# Patient Record
Sex: Female | Born: 1965 | Hispanic: Yes | Marital: Single | Smoking: Never smoker
Health system: Southern US, Community
[De-identification: ages and names within clinical notes are randomized; demographics above are authoritative.]

## PROBLEM LIST (undated history)

## (undated) DIAGNOSIS — E119 Type 2 diabetes mellitus without complications: Secondary | ICD-10-CM

## (undated) HISTORY — DX: Type 2 diabetes mellitus without complications: E11.9

---

## 1997-08-01 ENCOUNTER — Other Ambulatory Visit: Admission: RE | Admit: 1997-08-01 | Discharge: 1997-08-01 | Payer: Self-pay | Admitting: Obstetrics and Gynecology

## 1997-08-07 ENCOUNTER — Inpatient Hospital Stay (HOSPITAL_COMMUNITY): Admission: AD | Admit: 1997-08-07 | Discharge: 1997-08-07 | Payer: Self-pay | Admitting: Obstetrics & Gynecology

## 1997-08-17 ENCOUNTER — Ambulatory Visit (HOSPITAL_COMMUNITY): Admission: RE | Admit: 1997-08-17 | Discharge: 1997-08-17 | Payer: Self-pay | Admitting: Obstetrics and Gynecology

## 1997-08-31 ENCOUNTER — Other Ambulatory Visit: Admission: RE | Admit: 1997-08-31 | Discharge: 1997-08-31 | Payer: Self-pay | Admitting: Obstetrics and Gynecology

## 1997-09-26 ENCOUNTER — Other Ambulatory Visit: Admission: RE | Admit: 1997-09-26 | Discharge: 1997-09-26 | Payer: Self-pay | Admitting: Obstetrics and Gynecology

## 1997-10-03 ENCOUNTER — Other Ambulatory Visit: Admission: RE | Admit: 1997-10-03 | Discharge: 1997-10-03 | Payer: Self-pay | Admitting: Obstetrics and Gynecology

## 1997-10-24 ENCOUNTER — Encounter: Admission: RE | Admit: 1997-10-24 | Discharge: 1998-01-22 | Payer: Self-pay | Admitting: Obstetrics and Gynecology

## 1997-11-09 ENCOUNTER — Inpatient Hospital Stay (HOSPITAL_COMMUNITY): Admission: AD | Admit: 1997-11-09 | Discharge: 1997-11-09 | Payer: Self-pay | Admitting: *Deleted

## 1997-11-16 ENCOUNTER — Encounter (HOSPITAL_COMMUNITY): Admission: RE | Admit: 1997-11-16 | Discharge: 1997-12-11 | Payer: Self-pay | Admitting: Obstetrics & Gynecology

## 1997-11-23 ENCOUNTER — Inpatient Hospital Stay (HOSPITAL_COMMUNITY): Admission: AD | Admit: 1997-11-23 | Discharge: 1997-11-23 | Payer: Self-pay | Admitting: Obstetrics & Gynecology

## 1997-11-23 ENCOUNTER — Other Ambulatory Visit: Admission: RE | Admit: 1997-11-23 | Discharge: 1997-11-23 | Payer: Self-pay | Admitting: Obstetrics and Gynecology

## 1997-12-08 ENCOUNTER — Inpatient Hospital Stay (HOSPITAL_COMMUNITY): Admission: AD | Admit: 1997-12-08 | Discharge: 1997-12-12 | Payer: Self-pay | Admitting: Obstetrics & Gynecology

## 2006-05-04 ENCOUNTER — Ambulatory Visit: Payer: Self-pay | Admitting: Family Medicine

## 2006-05-04 ENCOUNTER — Ambulatory Visit (HOSPITAL_COMMUNITY): Admission: RE | Admit: 2006-05-04 | Discharge: 2006-05-04 | Payer: Self-pay | Admitting: Obstetrics & Gynecology

## 2006-05-11 ENCOUNTER — Ambulatory Visit: Payer: Self-pay | Admitting: Obstetrics & Gynecology

## 2006-05-18 ENCOUNTER — Ambulatory Visit: Payer: Self-pay | Admitting: Obstetrics & Gynecology

## 2006-05-25 ENCOUNTER — Ambulatory Visit (HOSPITAL_COMMUNITY): Admission: RE | Admit: 2006-05-25 | Discharge: 2006-05-25 | Payer: Self-pay | Admitting: Obstetrics & Gynecology

## 2006-05-28 ENCOUNTER — Ambulatory Visit: Payer: Self-pay | Admitting: Family Medicine

## 2006-06-02 DIAGNOSIS — E119 Type 2 diabetes mellitus without complications: Secondary | ICD-10-CM

## 2006-06-02 HISTORY — PX: TUBAL LIGATION: SHX77

## 2006-06-02 HISTORY — DX: Type 2 diabetes mellitus without complications: E11.9

## 2006-06-08 ENCOUNTER — Ambulatory Visit: Payer: Self-pay | Admitting: Obstetrics & Gynecology

## 2006-06-22 ENCOUNTER — Ambulatory Visit (HOSPITAL_COMMUNITY): Admission: RE | Admit: 2006-06-22 | Discharge: 2006-06-22 | Payer: Self-pay | Admitting: Obstetrics & Gynecology

## 2006-06-22 ENCOUNTER — Ambulatory Visit: Payer: Self-pay | Admitting: Obstetrics & Gynecology

## 2006-06-25 ENCOUNTER — Ambulatory Visit: Payer: Self-pay | Admitting: Family Medicine

## 2006-06-29 ENCOUNTER — Ambulatory Visit (HOSPITAL_COMMUNITY): Admission: RE | Admit: 2006-06-29 | Discharge: 2006-06-29 | Payer: Self-pay | Admitting: Obstetrics & Gynecology

## 2006-06-29 ENCOUNTER — Ambulatory Visit: Payer: Self-pay | Admitting: Obstetrics & Gynecology

## 2006-07-02 ENCOUNTER — Ambulatory Visit: Payer: Self-pay | Admitting: Family Medicine

## 2006-07-06 ENCOUNTER — Ambulatory Visit (HOSPITAL_COMMUNITY): Admission: RE | Admit: 2006-07-06 | Discharge: 2006-07-06 | Payer: Self-pay | Admitting: Obstetrics & Gynecology

## 2006-07-06 ENCOUNTER — Ambulatory Visit: Payer: Self-pay | Admitting: Obstetrics & Gynecology

## 2006-07-09 ENCOUNTER — Ambulatory Visit: Payer: Self-pay | Admitting: Family Medicine

## 2006-07-13 ENCOUNTER — Ambulatory Visit (HOSPITAL_COMMUNITY): Admission: RE | Admit: 2006-07-13 | Discharge: 2006-07-13 | Payer: Self-pay | Admitting: Family Medicine

## 2006-07-13 ENCOUNTER — Ambulatory Visit: Payer: Self-pay | Admitting: Obstetrics & Gynecology

## 2006-07-16 ENCOUNTER — Ambulatory Visit: Payer: Self-pay | Admitting: Family Medicine

## 2006-07-20 ENCOUNTER — Ambulatory Visit (HOSPITAL_COMMUNITY): Admission: RE | Admit: 2006-07-20 | Discharge: 2006-07-20 | Payer: Self-pay | Admitting: Obstetrics and Gynecology

## 2006-07-20 ENCOUNTER — Ambulatory Visit: Payer: Self-pay | Admitting: Obstetrics & Gynecology

## 2006-07-23 ENCOUNTER — Ambulatory Visit: Payer: Self-pay | Admitting: Family Medicine

## 2006-07-27 ENCOUNTER — Ambulatory Visit (HOSPITAL_COMMUNITY): Admission: RE | Admit: 2006-07-27 | Discharge: 2006-07-27 | Payer: Self-pay | Admitting: Obstetrics and Gynecology

## 2006-07-27 ENCOUNTER — Ambulatory Visit: Payer: Self-pay | Admitting: Family Medicine

## 2006-07-30 ENCOUNTER — Ambulatory Visit: Payer: Self-pay | Admitting: Family Medicine

## 2006-08-03 ENCOUNTER — Ambulatory Visit: Payer: Self-pay | Admitting: Family Medicine

## 2006-08-03 ENCOUNTER — Ambulatory Visit (HOSPITAL_COMMUNITY): Admission: RE | Admit: 2006-08-03 | Discharge: 2006-08-03 | Payer: Self-pay | Admitting: Obstetrics and Gynecology

## 2006-08-06 ENCOUNTER — Ambulatory Visit: Payer: Self-pay | Admitting: Family Medicine

## 2006-08-09 ENCOUNTER — Ambulatory Visit: Payer: Self-pay | Admitting: Obstetrics and Gynecology

## 2006-08-09 ENCOUNTER — Inpatient Hospital Stay (HOSPITAL_COMMUNITY): Admission: AD | Admit: 2006-08-09 | Discharge: 2006-08-13 | Payer: Self-pay | Admitting: Family Medicine

## 2006-08-18 ENCOUNTER — Ambulatory Visit: Payer: Self-pay | Admitting: Obstetrics and Gynecology

## 2006-08-18 ENCOUNTER — Inpatient Hospital Stay (HOSPITAL_COMMUNITY): Admission: AD | Admit: 2006-08-18 | Discharge: 2006-08-18 | Payer: Self-pay | Admitting: Obstetrics & Gynecology

## 2010-01-29 ENCOUNTER — Ambulatory Visit: Payer: Self-pay | Admitting: Internal Medicine

## 2010-01-29 ENCOUNTER — Encounter (INDEPENDENT_AMBULATORY_CARE_PROVIDER_SITE_OTHER): Payer: Self-pay | Admitting: Family Medicine

## 2010-01-29 LAB — CONVERTED CEMR LAB
ALT: 10 units/L (ref 0–35)
Alkaline Phosphatase: 51 units/L (ref 39–117)
BUN: 11 mg/dL (ref 6–23)
Basophils Absolute: 0 10*3/uL (ref 0.0–0.1)
CO2: 27 meq/L (ref 19–32)
Chloride: 105 meq/L (ref 96–112)
Creatinine, Ser: 0.65 mg/dL (ref 0.40–1.20)
Glucose, Bld: 119 mg/dL — ABNORMAL HIGH (ref 70–99)
HCT: 40.3 % (ref 36.0–46.0)
HDL: 55 mg/dL (ref >39)
Hemoglobin: 12.9 g/dL (ref 12.0–15.0)
Lymphs Abs: 1.7 10*3/uL (ref 0.7–4.0)
Monocytes Absolute: 0.3 10*3/uL (ref 0.1–1.0)
Monocytes Relative: 6 % (ref 3–12)
Neutro Abs: 3.5 10*3/uL (ref 1.7–7.7)
Neutrophils Relative %: 63 % (ref 43–77)
Potassium: 4.6 meq/L (ref 3.5–5.3)
RBC: 4.15 M/uL (ref 3.87–5.11)
Total Protein: 7.2 g/dL (ref 6.0–8.3)
Triglycerides: 73 mg/dL (ref <150)

## 2010-02-20 ENCOUNTER — Ambulatory Visit (HOSPITAL_COMMUNITY): Admission: RE | Admit: 2010-02-20 | Discharge: 2010-02-20 | Payer: Self-pay | Admitting: Internal Medicine

## 2010-06-23 ENCOUNTER — Encounter: Payer: Self-pay | Admitting: *Deleted

## 2011-06-05 ENCOUNTER — Other Ambulatory Visit: Payer: Self-pay | Admitting: Family Medicine

## 2011-06-05 ENCOUNTER — Other Ambulatory Visit (HOSPITAL_COMMUNITY): Payer: Self-pay | Admitting: Family Medicine

## 2011-06-05 DIAGNOSIS — Z1231 Encounter for screening mammogram for malignant neoplasm of breast: Secondary | ICD-10-CM

## 2011-07-02 ENCOUNTER — Ambulatory Visit (HOSPITAL_COMMUNITY)
Admission: RE | Admit: 2011-07-02 | Discharge: 2011-07-02 | Disposition: A | Discharge status: Home / Self Care | Payer: Self-pay | Source: Ambulatory Visit | Attending: Family Medicine | Admitting: Family Medicine

## 2011-07-02 DIAGNOSIS — Z1231 Encounter for screening mammogram for malignant neoplasm of breast: Secondary | ICD-10-CM | POA: Insufficient documentation

## 2012-05-28 ENCOUNTER — Other Ambulatory Visit: Payer: Self-pay | Admitting: Family Medicine

## 2012-05-28 DIAGNOSIS — Z1231 Encounter for screening mammogram for malignant neoplasm of breast: Secondary | ICD-10-CM

## 2012-06-23 ENCOUNTER — Encounter: Payer: Self-pay | Admitting: Family Medicine

## 2012-06-23 ENCOUNTER — Ambulatory Visit (INDEPENDENT_AMBULATORY_CARE_PROVIDER_SITE_OTHER): Payer: Self-pay | Admitting: Family Medicine

## 2012-06-23 VITALS — BP 120/80 | HR 80 | Ht 65.5 in | Wt 219.0 lb

## 2012-06-23 DIAGNOSIS — E119 Type 2 diabetes mellitus without complications: Secondary | ICD-10-CM

## 2012-06-23 LAB — POCT GLYCOSYLATED HEMOGLOBIN (HGB A1C): Hemoglobin A1C: 8

## 2012-06-23 NOTE — Assessment & Plan Note (Signed)
A1C of 8 resulted after patient had left. Will call patient to discuss results. PAtient agreed to limit soda consumption to once a week. Walks around track in the summer time but not in cold weather. Will discuss diet and exercises changes vs starting metformin.  Follow up in 3 months. At that point, will also obtain labs: CMP, UA for microalb

## 2012-06-23 NOTE — Patient Instructions (Addendum)
   Diabetes y Doroteo Glassman fsica (Diabetes and Exercise) La actividad fsica regular es muy importante y [place] a:   Chief Operating Officer el nivel de glucosa en sangre (azcar).  Disminuir la presin arterial.  Controlar el colesterol en sangre (colesterol y triglicridos).  Mejorar el estado de salud general. BENEFICIOS DE LA ACTIVIDAD FSICA  Mejora el buen estado fsico.  Mejora la flexibilidad.  Aumenta la resistencia.  Aumenta la densidad sea.  Favorece el control del peso.  Aumenta la fuerza muscular.  Disminuye la Art gallery manager.  Mejora la utilizacin de la insulina por parte del organismo.  Aumenta la sensibilidad a la insulina.  Reduce las necesidades de insulina.  Har que se sienta mejor.  Reduce el estrs y las tensiones. Las personas diabticas que incorporan la actividad fsica a su estilo de vida obtienen beneficios adicionales.  Prdida de peso.  Reduccin del apetito.  Mejora la utilizacin de la glucosa por parte del organismo.  Disminuye los factores de riesgo para las enfermedades cardacas.  Disminuye el colesterol y los triglicridos.  Eleva el nivel de colesterol bueno (lipoproteinas de alta densidad HDL).  Disminuye el nivel de Banker.  Disminuye la presin arterial. DIABETES TIPO I Y ACTIVIDAD FSICA  La actividad fsica disminuir el nivel de glucosa en [place].  Si el nivel de glucosa en sangre es de ms de 240 mg/dl, controle las cetonas en la [place]. Si hay cetonas, no realice actividad fisica.  El sitio de inyeccin de la insulina puede requerir un ajuste cuando se realiza actividad fsica. Evite inyectarse insulina en las zonas del cuerpo que ejercitar. Por ejemplo, evite inyectarse insulina en:  Los brazos, si juega al tenis.  Las piernas, si corre. Para obtener ms informacion, consulte a su mdico.  Lleve un registro de:  La ingesta de alimentos.  El tipo y cantidad de [place].  Los momentos esperables  de picos de accin de la insulina.  Los niveles de Event organiser. Hgalo antes, durante y despus de Printmaker actividad fsica. Verifique los registros junto con su mdico. Esto ser de utilidad para la confeccin de pautas para ajustar la ingesta de alimentos o las cantidades de [place].  DIABETES TIPO 2 Y ACTIVIDAD FSICA  La actividad fsica regular ayuda a controlar el nivel de glucosa en [place].  La actividad fsica es importante porque:  Aumenta la sensibilidad del organismo a la insulina.  Mejora el control del nivel de Event organiser.  Reduce el riesgo de enfermedades cardiacas. Disminuye el colesterol srico y los triglicridos. Disminuye la presin arterial.  Las personas que reciben insulina o agentes hipoglucemiantes por va oral deben controlar la aparicin de signos de hipoglucemia (mareos, temblores, sudoracin, escalofros y confusin).  Durante la actividad fsica se pierde Data processing manager. Esta prdida de lquidos debe reponerse. De este modo se evita la prdida de lquidos corporales (deshidratacin) y el golpe de Airline pilot. Comente con su mdico antes de comenzar un programa de actividad fsica para verificar que sea seguro para usted. Recuerde, cualquier actividad es mejor que ninguna.  Document Released: 06/08/2007 Document Revised: 08/11/2011 [facility] Patient Information 2013 [place], [place].

## 2012-06-23 NOTE — Progress Notes (Signed)
Patient ID: Christine Pineda, female   DOB: 11-Sep-1965, 47 y.o.   MRN: [id number]  Chief Complaint  Patient presents with  . NP-est care  . Diabetes     HPI Christine Pineda with h/o ofDM2 is a 47 y.o. Female who presents to establish care.  - DM2: not on medications. Was on meds when pregnant with GDM. Has history of GDM in both of her pregnancies. Diabetes did not resolve after last pregnancy in 2008.  Walks at work. Eats vegetables. Drinks 2 sodas per day (diet and non diet). She denies any change in vision, any numbness or tingling in her lower extremities.   Past Medical History  Diagnosis Date  . Diabetes mellitus without complication 2008    Past Surgical History  Procedure Date  . Cesarean section     for macrosomia    Family History  Problem Relation Age of Onset  . Diabetes Father     Social History History  Substance Use Topics  . Smoking status: Never Smoker   . Smokeless tobacco: Not on file  . Alcohol Use: Not on file    No Known Allergies  No current outpatient prescriptions on file.    Review of Systems Review of Systems No nausea, no vomiting, no dysuria, no polyuria, no diarrhea, no constipation, no abdomina pain, no fever, no myalgias Blood pressure 120/80, pulse 80, height 5' 5.5" (1.664 m), weight 219 lb (99.338 kg).  Physical Exam Physical Exam Physical Examination: General appearance - alert, well appearing, and in no distress Chest - clear to auscultation, no wheezes, rales or rhonchi, symmetric air entry Heart - normal rate, regular rhythm, normal S1, S2, no murmurs, rubs, clicks or gallops Abdomen - soft, nontender, nondistended, no masses or organomegaly Neurological - alert, oriented, normal speech, no focal findings or movement disorder noted Extremities - peripheral pulses normal, no pedal edema, no clubbing or cyanosis  Data Reviewed None available at time of visit.   Assessment    See problem list    Plan      See problem list.        Marena Chancy 06/23/2012, 7:25 PM

## 2012-07-02 ENCOUNTER — Ambulatory Visit (HOSPITAL_COMMUNITY)
Admission: RE | Admit: 2012-07-02 | Discharge: 2012-07-02 | Disposition: A | Discharge status: Home / Self Care | Payer: Self-pay | Source: Ambulatory Visit | Attending: Family Medicine | Admitting: Family Medicine

## 2012-07-02 DIAGNOSIS — Z1231 Encounter for screening mammogram for malignant neoplasm of breast: Secondary | ICD-10-CM | POA: Insufficient documentation

## 2012-07-05 ENCOUNTER — Telehealth: Payer: Self-pay | Admitting: Family Medicine

## 2012-07-05 MED ORDER — METFORMIN HCL 500 MG PO TABS: 500.0000 mg | ORAL_TABLET | Freq: Every day | ORAL | Status: DC

## 2012-07-05 NOTE — Telephone Encounter (Signed)
Marines Christine Pineda spoke with patient who would like to start medicaiton for diabetes and A1C of 8.0. SHe will also be working on diet and exercise at the same time.  [facility] 500mg  [facility].   Christine Pineda, PGY-2 Family Medicine Resident

## 2012-08-25 ENCOUNTER — Ambulatory Visit (INDEPENDENT_AMBULATORY_CARE_PROVIDER_SITE_OTHER): Payer: No Typology Code available for payment source | Admitting: Family Medicine

## 2012-08-25 VITALS — BP 119/80 | HR 83 | Ht 66.0 in | Wt 216.0 lb

## 2012-08-25 DIAGNOSIS — E119 Type 2 diabetes mellitus without complications: Secondary | ICD-10-CM

## 2012-08-25 LAB — COMPREHENSIVE METABOLIC PANEL
ALT: 13 U/L (ref 0–35)
AST: 16 U/L (ref 0–37)
Albumin: 4.4 g/dL (ref 3.5–5.2)
Alkaline Phosphatase: 61 U/L (ref 39–117)
CO2: 26 mEq/L (ref 19–32)
Calcium: 9.2 mg/dL (ref 8.4–10.5)
Creat: 0.55 mg/dL (ref 0.50–1.10)
Glucose, Bld: 172 mg/dL — ABNORMAL HIGH (ref 70–99)
Sodium: 137 mEq/L (ref 135–145)
Total Bilirubin: 0.5 mg/dL (ref 0.3–1.2)
Total Protein: 7.5 g/dL (ref 6.0–8.3)

## 2012-08-25 LAB — LIPID PANEL
Cholesterol: 196 mg/dL (ref 0–200)
LDL Cholesterol: 124 mg/dL — ABNORMAL HIGH (ref 0–99)
VLDL: 24 mg/dL (ref 0–40)

## 2012-08-25 MED ORDER — METFORMIN HCL 500 MG PO TABS: 500.0000 mg | ORAL_TABLET | Freq: Two times a day (BID) | ORAL | Status: DC

## 2012-08-25 NOTE — Patient Instructions (Signed)
Follow up in 2 months

## 2012-08-25 NOTE — Progress Notes (Signed)
Patient ID: Christine Pineda    DOB: June 26, 1965, 47 y.o.   MRN: [id number] --- Subjective:  Christine Pineda is a 47 y.o.female who presents for follow up on DM. Visit was conducted with [employer] Spanish Interpreter - DM2: taking metformin 500mg  daily which she is tolerating without side effects. No hypoglycemic symptoms. No foot sores, no loss of sensation in lower extremities.  Walks for her work and walks 20 minutes every other day.   ROS: see HPI Past Medical History: reviewed and updated medications and allergies. Social History: Tobacco: none  Objective: Filed Vitals:   08/25/12 0944  BP: 119/80  Pulse: 83    Physical Examination:   General appearance - alert, well appearing, and in no distress Neck - supple, no significant adenopathy Chest - clear to auscultation, no wheezes, rales or rhonchi, symmetric air entry Heart - normal rate, regular rhythm, normal S1, S2, no murmurs, rubs, clicks or gallops Abdomen - soft, nontender, nondistended, no masses or organomegaly Extremities - peripheral pulses normal, no pedal edema, sensation to microfilament intact bilaterally

## 2012-08-26 ENCOUNTER — Encounter: Payer: Self-pay | Admitting: Family Medicine

## 2012-08-26 LAB — MICROALBUMIN / CREATININE URINE RATIO: Microalb Creat Ratio: 4.2 mg/g (ref 0.0–30.0)

## 2012-08-26 NOTE — Assessment & Plan Note (Addendum)
Patient tolerating metformin. Will increase to metformin 500mg  bid. A1C due after April 22nd. Will also obtain CMP, fasting lipid panel and urine microalbumin today. Referral for ophthalmology for diabetic eye exam sent today.

## 2012-09-28 ENCOUNTER — Other Ambulatory Visit: Payer: Self-pay | Admitting: Family Medicine

## 2012-09-28 MED ORDER — PRAVASTATIN SODIUM 40 MG PO TABS: 40.0000 mg | ORAL_TABLET | Freq: Every day | ORAL | Status: DC

## 2012-09-28 NOTE — Telephone Encounter (Signed)
Sent Rx for pravastatin 40mg  daily for primary prevention for diabetes.   Marena Chancy, PGY-2 Family Medicine Resident

## 2013-01-05 ENCOUNTER — Encounter: Payer: Self-pay | Admitting: Family Medicine

## 2013-01-05 ENCOUNTER — Ambulatory Visit (INDEPENDENT_AMBULATORY_CARE_PROVIDER_SITE_OTHER): Payer: No Typology Code available for payment source | Admitting: Family Medicine

## 2013-01-05 VITALS — BP 120/78 | HR 74 | Ht 66.0 in | Wt 212.0 lb

## 2013-01-05 DIAGNOSIS — E119 Type 2 diabetes mellitus without complications: Secondary | ICD-10-CM

## 2013-01-05 MED ORDER — PRAVASTATIN SODIUM 40 MG PO TABS: 40.0000 mg | ORAL_TABLET | Freq: Every day | ORAL | Status: DC

## 2013-01-05 MED ORDER — METFORMIN HCL 1000 MG PO TABS: 1000.0000 mg | ORAL_TABLET | Freq: Two times a day (BID) | ORAL | Status: DC

## 2013-01-05 MED ORDER — LISINOPRIL 5 MG PO TABS: 5.0000 mg | ORAL_TABLET | Freq: Every day | ORAL | Status: DC

## 2013-01-05 NOTE — Progress Notes (Signed)
Subjective:     Patient ID: Christine Pineda, female   DOB: 13-Jun-1965, 47 y.o.   MRN: [id number]  HPI 47 yo female with h/o Type 2 diabetes who presents for diabetes follow up.  Appointment was conducted in entirety with Spanish [place] Phone Interpreter Diabetes Mellitus, Type 2 Pt presents for follow up for her diabetes mellitus type 2. She has no new complaints. She requests refills for metformin. She takes metformin 500mg  bid and denies any side effects with medicine. She continues to exercise regularly like she has been and eat a low carb and fat diet. She has lost 4 or 5 pounds, per patient (and confirmed by chart review) intentionally.  She reports occasional numbness and tingling in lower extremities. No sores or ulcers. No vision change. She is in process of getting appointment with ophthalmology.   Pt requests refills for her pravastatin as well.   Review of Systems  Constitutional: Negative for fever, chills and fatigue.  Eyes: Negative for visual disturbance.  Respiratory: Negative for cough and shortness of breath.   Cardiovascular: Negative for chest pain and leg swelling.  Genitourinary: Negative for dysuria, urgency and hematuria.  Neurological: Negative for dizziness, weakness, numbness and headaches.       States sometimes when she forgets to take her medications, she experiences tingling in her extremities.         Objective:   Physical Exam  Constitutional: She is oriented to person, place, and time.  HENT:  Head: Normocephalic and atraumatic.  Eyes: Conjunctivae and EOM are normal. Pupils are equal, round, and reactive to light.  No hemorrhages noted on limited fundal exam  Cardiovascular: Normal rate, regular rhythm, normal heart sounds and intact distal pulses.  Exam reveals no gallop and no friction rub.   No murmur heard. Pulmonary/Chest: Effort normal and breath sounds normal. She has no wheezes. She has no rales.  Abdominal: Soft. Bowel sounds are  normal. There is no tenderness.  Neurological: She is alert and oriented to person, place, and time.  Sensation to light touch was intact b/l in upper/ lower extremities.   Skin: Skin is warm and dry. No rash noted. No erythema.   BP 120/78  Pulse 74  Ht 5\' 6"  (1.676 m)  Wt 212 lb (96.163 kg)  BMI 34.23 kg/m2  A1C: 7.2     Assessment:     1. Diabetes mellitus type2 See problems list    Plan:     1. Diabetes mellitus type2: Patient doing well. States her blood sugars have been running in the 100-120 range. Her A1C returned at 7.2 from 8.0 last visit. We will increase her metformin from 500 to 1000 mg BID PO. We also will start her on Lisinopril at a low dose for renal protection. The risks and benefits and side effects including cough and angioedema were discussed thoroughly with the patient through an interpreter on the phone and patient understands.

## 2013-01-08 NOTE — Assessment & Plan Note (Addendum)
A1C improved from 8 at last visit. Now 7.2. Will increase metformin to 1000mg  bid. Continue pravastatin: moderate intensity statin for 10 year ASCVD risk of 1.7%.  Will start lisinopril 5mg  for renal protection. Reviewed side effects with patient who expressed understanding. Patient to return for lab appointment in 2 weeks for BMP in context of lisinopril use.

## 2013-01-26 ENCOUNTER — Other Ambulatory Visit: Payer: No Typology Code available for payment source

## 2013-01-26 DIAGNOSIS — E119 Type 2 diabetes mellitus without complications: Secondary | ICD-10-CM

## 2013-01-26 LAB — BASIC METABOLIC PANEL
BUN: 11 mg/dL (ref 6–23)
Creat: 0.75 mg/dL (ref 0.50–1.10)
Sodium: 139 mEq/L (ref 135–145)

## 2013-01-26 NOTE — Progress Notes (Signed)
BMP DONE TODAY Christine Pineda 

## 2013-02-02 ENCOUNTER — Encounter: Payer: Self-pay | Admitting: Family Medicine

## 2013-02-16 ENCOUNTER — Ambulatory Visit (INDEPENDENT_AMBULATORY_CARE_PROVIDER_SITE_OTHER): Payer: No Typology Code available for payment source | Admitting: Family Medicine

## 2013-02-16 ENCOUNTER — Encounter: Payer: Self-pay | Admitting: Family Medicine

## 2013-02-16 VITALS — BP 121/78 | HR 76 | Temp 98.6°F | Ht 66.0 in | Wt 209.0 lb

## 2013-02-16 DIAGNOSIS — K0889 Other specified disorders of teeth and supporting structures: Secondary | ICD-10-CM

## 2013-02-16 DIAGNOSIS — Z0000 Encounter for general adult medical examination without abnormal findings: Secondary | ICD-10-CM

## 2013-02-16 DIAGNOSIS — K089 Disorder of teeth and supporting structures, unspecified: Secondary | ICD-10-CM

## 2013-02-16 DIAGNOSIS — E119 Type 2 diabetes mellitus without complications: Secondary | ICD-10-CM

## 2013-02-16 DIAGNOSIS — Z23 Encounter for immunization: Secondary | ICD-10-CM

## 2013-02-16 MED ORDER — LISINOPRIL 5 MG PO TABS: 5.0000 mg | ORAL_TABLET | Freq: Every day | ORAL | Status: DC

## 2013-02-16 MED ORDER — PRAVASTATIN SODIUM 40 MG PO TABS: 40.0000 mg | ORAL_TABLET | Freq: Every day | ORAL | Status: DC

## 2013-02-16 MED ORDER — METFORMIN HCL 1000 MG PO TABS: 1000.0000 mg | ORAL_TABLET | Freq: Two times a day (BID) | ORAL | Status: DC

## 2013-02-16 NOTE — Progress Notes (Signed)
Patient ID: Christine Pineda    DOB: 1965/12/20, 47 y.o.   MRN: [id number] --- Subjective:  Christine Pineda is a 47 y.o.female who presents for yearly physical. History and physical were conducted with presence of a Spanish interpreter  Concerns include the following:  - 2 bottom teeth are loose. Started with one tooth and the one next to it became loose as well. Ongoing for 2 months. No pain other than sometimes when she eats. Last time she saw a dentist was 6 years ago. No fevers, no chills  - GYN: no h/o abnormal PAPs. Patient reports that her last pap smear was in 2012, although the records show normal PAP in 2013. No vaginal discharge. Continues to have a regular period. BTL: September 2012.  Mammogram normal in January 2014. Denies any lumps or bumps.   - f/u DM and HTN: tolerating lisinopril well. No cough, no side effects. No light headedness. Doesn't check BP's at home. Continues to take metformin and pravastatin.    ROS: see HPI Past Medical History: reviewed and updated medications and allergies. Social History: Tobacco: none  Objective: Filed Vitals:   02/16/13 0924  BP: 121/78  Pulse: 76  Temp: 98.6 F (37 C)    Physical Examination:   General appearance - alert, well appearing, and in no distress Nose - normal and patent, no erythema, discharge or polyps Mouth - bottom right lateral incisor and adjacent canine mildly loose with manipulation. No mass palpated along gum.  Neck - supple, no significant adenopathy Chest - clear to auscultation, no wheezes, rales or rhonchi, symmetric air entry Heart - S1S2, rrr, no murmur Abdomen - soft, nontender, nondistended Breast - no masses, no skin changes, no nipple discharge.  Extremities - peripheral pulses normal, no pedal edema

## 2013-02-16 NOTE — Patient Instructions (Addendum)
Continue with the metformin, lisinopril and pravastatin.   Follow up for diabetes in 4 months.

## 2013-02-20 DIAGNOSIS — Z0000 Encounter for general adult medical examination without abnormal findings: Secondary | ICD-10-CM | POA: Insufficient documentation

## 2013-02-20 NOTE — Assessment & Plan Note (Signed)
Continue metformin and pravastatin.  Recheck A1C in December Continue lisinopril for kidney protection. Tolerating the medicine well without hypotension.

## 2013-02-20 NOTE — Assessment & Plan Note (Signed)
-   documented PAP of January 2013: patient doesn't recognize having this done. She reports PAP smear done in 2012. For this reason, will repeat pap smear 3 years after 2012 PAP in 2015.  - mammogram up to date - encouraged daily exercise.  - flu shot given

## 2013-02-24 ENCOUNTER — Ambulatory Visit: Payer: No Typology Code available for payment source

## 2013-03-01 ENCOUNTER — Encounter: Payer: Self-pay | Admitting: Family Medicine

## 2013-07-05 ENCOUNTER — Ambulatory Visit: Payer: Self-pay

## 2013-07-06 ENCOUNTER — Other Ambulatory Visit (HOSPITAL_COMMUNITY): Payer: Self-pay | Admitting: Diagnostic Radiology

## 2013-07-06 DIAGNOSIS — Z1231 Encounter for screening mammogram for malignant neoplasm of breast: Secondary | ICD-10-CM

## 2013-07-12 ENCOUNTER — Ambulatory Visit (HOSPITAL_COMMUNITY)
Admission: RE | Admit: 2013-07-12 | Discharge: 2013-07-12 | Disposition: A | Discharge status: Home / Self Care | Payer: No Typology Code available for payment source | Source: Ambulatory Visit | Attending: Diagnostic Radiology | Admitting: Diagnostic Radiology

## 2013-07-12 DIAGNOSIS — Z1231 Encounter for screening mammogram for malignant neoplasm of breast: Secondary | ICD-10-CM | POA: Insufficient documentation

## 2013-08-08 ENCOUNTER — Ambulatory Visit: Payer: No Typology Code available for payment source

## 2013-11-01 ENCOUNTER — Other Ambulatory Visit: Payer: Self-pay | Admitting: Family Medicine

## 2013-11-28 ENCOUNTER — Encounter: Payer: Self-pay | Admitting: Family Medicine

## 2013-11-28 ENCOUNTER — Ambulatory Visit (INDEPENDENT_AMBULATORY_CARE_PROVIDER_SITE_OTHER): Payer: No Typology Code available for payment source | Admitting: Family Medicine

## 2013-11-28 VITALS — BP 110/75 | HR 76 | Ht 66.0 in | Wt 201.0 lb

## 2013-11-28 DIAGNOSIS — E119 Type 2 diabetes mellitus without complications: Secondary | ICD-10-CM

## 2013-11-28 LAB — POCT GLYCOSYLATED HEMOGLOBIN (HGB A1C): Hemoglobin A1C: 6.7

## 2013-11-28 MED ORDER — METFORMIN HCL 1000 MG PO TABS: 1000.0000 mg | ORAL_TABLET | Freq: Two times a day (BID) | ORAL | Status: DC

## 2013-11-28 MED ORDER — PRAVASTATIN SODIUM 40 MG PO TABS: ORAL_TABLET | ORAL | Status: DC

## 2013-11-28 NOTE — Progress Notes (Signed)
Patient ID: Christine Pineda    DOB: March 26, 1966, 48 y.o.   MRN: [id number] --- Subjective:  Christine Pineda is a 48 y.o.female with h/o DM2 and hyperlipidemia who presents for follow up on diabetes and medication refills.   # Diabetes: - medications: metformin 1000mg  bid - missed doses in 1 week:0 - hypoglycemic events: none - CBG's at home: 120 - frequency of monitoring: occasional  - changes in vision? Sometimes difficulty seeing from afar and close.  - numbness or tingling in lower extremities? rarely - sores or ulcers? none - seen by podiatry? no - last eye check? 02/16/13: normal retinal scan - diet: tried to avoid sweet drinks - exercise: 3-4 times per week, 30 minutes on bicycle.   Takes pravastatin 40mg  daily for hyperlipidemia.   ROS: see HPI Past Medical History: reviewed and updated medications and allergies. Social History: Tobacco: never smoker  Objective: Filed Vitals:   11/28/13 1453  BP: 110/75  Pulse: 76    Physical Examination:   General appearance - alert, well appearing, and in no distress Chest - clear to auscultation, no wheezes, rales or rhonchi, symmetric air entry Heart - normal rate, regular rhythm, normal S1, S2, no murmurs Abdomen - soft, nontender, nondistended, no masses or organomegaly Extremities - normal microfilament testing bilaterally, 2+ DP bilaterally, small callus on lateral aspect of 5th toe bilaterally as well as on medial aspect of great toe, left more than right

## 2013-11-28 NOTE — Assessment & Plan Note (Signed)
A1C improved at 6.7 from 7.2.  Continue metformin 1000mg  bid.  Continue pravastatin 40mg  daily for now. Will check fasting lipid panel. She may need to be placed on higher intensity statin. Cost may be an issue however.  Check CMP Retinal scan to be repeated in September 2015. Diabetic foot exam done today Follow up in 3 months.

## 2013-11-28 NOTE — Patient Instructions (Signed)
Please return to the clinic for a lab appointment only. You can make the appointment by calling a couple of days prior to the day you would like to get your labs drawn.  Please come fasting for the labwork: no food or drink other than water after midnight    Follow up in 3 months for the diabetes.

## 2013-12-01 ENCOUNTER — Other Ambulatory Visit: Payer: No Typology Code available for payment source

## 2013-12-01 DIAGNOSIS — E119 Type 2 diabetes mellitus without complications: Secondary | ICD-10-CM

## 2013-12-01 LAB — LIPID PANEL
CHOL/HDL RATIO: 2.5 ratio
CHOLESTEROL: 130 mg/dL (ref 0–200)
HDL: 51 mg/dL (ref >39)
LDL Cholesterol: 65 mg/dL (ref 0–99)
Triglycerides: 69 mg/dL (ref <150)
VLDL: 14 mg/dL (ref 0–40)

## 2013-12-01 LAB — COMPREHENSIVE METABOLIC PANEL
ALK PHOS: 50 U/L (ref 39–117)
ALT: 9 U/L (ref 0–35)
AST: 10 U/L (ref 0–37)
Albumin: 4.1 g/dL (ref 3.5–5.2)
BUN: 12 mg/dL (ref 6–23)
CALCIUM: 8.8 mg/dL (ref 8.4–10.5)
CO2: 27 mEq/L (ref 19–32)
Chloride: 104 mEq/L (ref 96–112)
Creat: 0.65 mg/dL (ref 0.50–1.10)
GLUCOSE: 138 mg/dL — AB (ref 70–99)
Potassium: 4.9 mEq/L (ref 3.5–5.3)
SODIUM: 137 meq/L (ref 135–145)
TOTAL PROTEIN: 6.7 g/dL (ref 6.0–8.3)
Total Bilirubin: 0.4 mg/dL (ref 0.2–1.2)

## 2013-12-01 NOTE — Progress Notes (Signed)
CMP AND FLP DONE TODAY Dima Mini 

## 2014-01-10 ENCOUNTER — Other Ambulatory Visit: Payer: Self-pay | Admitting: Family Medicine

## 2014-01-10 DIAGNOSIS — E119 Type 2 diabetes mellitus without complications: Secondary | ICD-10-CM

## 2014-01-10 NOTE — Telephone Encounter (Signed)
Pt left voicemail on spanish line stating she has been waiting two weeks for refill on cholesterol and diabetes medications but [facility] has yet to receive refill orders as of this morning.  Please f/u with pt using spanish interpreter/line.

## 2014-01-11 MED ORDER — METFORMIN HCL 1000 MG PO TABS: 1000.0000 mg | ORAL_TABLET | Freq: Two times a day (BID) | ORAL | Status: DC

## 2014-01-11 MED ORDER — PRAVASTATIN SODIUM 40 MG PO TABS: ORAL_TABLET | ORAL | Status: DC

## 2014-01-11 NOTE — Telephone Encounter (Signed)
I have not received any refill request for this pt.  Last request for in June.  Request will be forwarded to PCP.  Clovis PuMartin, Audreyanna Butkiewicz L, RN

## 2014-01-19 ENCOUNTER — Telehealth: Payer: Self-pay | Admitting: Family Medicine

## 2014-01-19 NOTE — Telephone Encounter (Signed)
Pt LVM on 01/18/14 stating that she received call from Dr Mal MistyNetty stating cholesterol medication was sent to pharmacy [facility] She went to pick up medication and was told that they have not received a script for refill.

## 2014-01-19 NOTE — Telephone Encounter (Signed)
Left voice message for pt using language line stating that [facility]  Clovis PuMartin, Kadynce Bonds L, RN

## 2014-01-30 ENCOUNTER — Telehealth: Payer: Self-pay | Admitting: Family Medicine

## 2014-01-30 MED ORDER — PRAVASTATIN SODIUM 40 MG PO TABS: ORAL_TABLET | ORAL | Status: DC

## 2014-01-30 NOTE — Telephone Encounter (Signed)
Will change prescription. Now #30.

## 2014-01-30 NOTE — Telephone Encounter (Signed)
Pt LVM asking if its possible to change cholesterol medication script from 90 tablets to 30 tablets due to cost difference.

## 2014-02-17 ENCOUNTER — Ambulatory Visit: Payer: Self-pay

## 2014-07-21 ENCOUNTER — Other Ambulatory Visit: Payer: Self-pay | Admitting: Family Medicine

## 2014-07-21 DIAGNOSIS — Z1231 Encounter for screening mammogram for malignant neoplasm of breast: Secondary | ICD-10-CM

## 2014-07-27 ENCOUNTER — Ambulatory Visit (HOSPITAL_COMMUNITY)
Admission: RE | Admit: 2014-07-27 | Discharge: 2014-07-27 | Disposition: A | Discharge status: Home / Self Care | Payer: Self-pay | Source: Ambulatory Visit | Attending: Internal Medicine | Admitting: Internal Medicine

## 2014-07-27 DIAGNOSIS — Z1231 Encounter for screening mammogram for malignant neoplasm of breast: Secondary | ICD-10-CM | POA: Insufficient documentation

## 2014-08-03 ENCOUNTER — Other Ambulatory Visit (HOSPITAL_COMMUNITY)
Admission: RE | Admit: 2014-08-03 | Discharge: 2014-08-03 | Disposition: A | Discharge status: Home / Self Care | Payer: Self-pay | Source: Ambulatory Visit | Attending: Family Medicine | Admitting: Family Medicine

## 2014-08-03 ENCOUNTER — Ambulatory Visit (INDEPENDENT_AMBULATORY_CARE_PROVIDER_SITE_OTHER): Payer: Self-pay | Admitting: Family Medicine

## 2014-08-03 ENCOUNTER — Encounter: Payer: Self-pay | Admitting: Family Medicine

## 2014-08-03 VITALS — BP 124/65 | HR 75 | Temp 97.9°F | Ht 66.0 in | Wt 212.1 lb

## 2014-08-03 DIAGNOSIS — Z124 Encounter for screening for malignant neoplasm of cervix: Secondary | ICD-10-CM

## 2014-08-03 DIAGNOSIS — E785 Hyperlipidemia, unspecified: Secondary | ICD-10-CM

## 2014-08-03 DIAGNOSIS — Z01419 Encounter for gynecological examination (general) (routine) without abnormal findings: Secondary | ICD-10-CM | POA: Insufficient documentation

## 2014-08-03 DIAGNOSIS — E119 Type 2 diabetes mellitus without complications: Secondary | ICD-10-CM

## 2014-08-03 DIAGNOSIS — E669 Obesity, unspecified: Secondary | ICD-10-CM

## 2014-08-03 DIAGNOSIS — Z1151 Encounter for screening for human papillomavirus (HPV): Secondary | ICD-10-CM | POA: Insufficient documentation

## 2014-08-03 DIAGNOSIS — Z113 Encounter for screening for infections with a predominantly sexual mode of transmission: Secondary | ICD-10-CM | POA: Insufficient documentation

## 2014-08-03 DIAGNOSIS — Z202 Contact with and (suspected) exposure to infections with a predominantly sexual mode of transmission: Secondary | ICD-10-CM

## 2014-08-03 LAB — POCT GLYCOSYLATED HEMOGLOBIN (HGB A1C): HEMOGLOBIN A1C: 7.9

## 2014-08-03 LAB — COMPREHENSIVE METABOLIC PANEL
ALBUMIN: 4.3 g/dL (ref 3.5–5.2)
ALT: 11 U/L (ref 0–35)
AST: 11 U/L (ref 0–37)
Alkaline Phosphatase: 63 U/L (ref 39–117)
BUN: 8 mg/dL (ref 6–23)
CALCIUM: 9.1 mg/dL (ref 8.4–10.5)
CHLORIDE: 102 meq/L (ref 96–112)
CO2: 28 meq/L (ref 19–32)
Creat: 0.6 mg/dL (ref 0.50–1.10)
GLUCOSE: 155 mg/dL — AB (ref 70–99)
Potassium: 3.9 mEq/L (ref 3.5–5.3)
Sodium: 137 mEq/L (ref 135–145)
Total Bilirubin: 0.5 mg/dL (ref 0.2–1.2)
Total Protein: 6.9 g/dL (ref 6.0–8.3)

## 2014-08-03 LAB — LIPID PANEL
Cholesterol: 168 mg/dL (ref 0–200)
HDL: 51 mg/dL (ref >=46)
LDL Cholesterol: 98 mg/dL (ref 0–99)
Total CHOL/HDL Ratio: 3.3 Ratio
Triglycerides: 93 mg/dL (ref <150)
VLDL: 19 mg/dL (ref 0–40)

## 2014-08-03 MED ORDER — METFORMIN HCL 1000 MG PO TABS
1000.0000 mg | ORAL_TABLET | Freq: Two times a day (BID) | ORAL | Status: DC
Start: 2014-08-03 — End: 2014-09-18

## 2014-08-03 NOTE — Assessment & Plan Note (Signed)
A1C 7.9. Patient's diet has worsened and weight has increased  Discussed plan for weight loss  Continue metformin with plans to add second agent if A1C not improved  Follow-up in 3 months

## 2014-08-03 NOTE — Patient Instructions (Addendum)
Thank you for coming to see me today. It was a pleasure. Today we talked about:   Please stop taking Pravastatin.  Please make an appointment to see me in 3 months for follow-up.  If you have any questions or concerns, please do not hesitate to call the office at [phone].  Sincerely,  Jacquelin Hawkingalph Nettey, MD   Ejercicios para perder peso (Exercise to Lose Weight) La actividad fsica y Neomia Dearuna dieta saludable ayudan a perder peso. El mdico podr sugerirle ejercicios especficos. IDEAS Y CONSEJOS PARA HACER EJERCICIOS  Elija opciones econmicas que disfrute hacer , como caminar, andar en bicicleta o los vdeos para ejercitarse.  Utilice las Microbiologistescaleras en lugar del ascensor.  Camine durante la hora del almuerzo.  Estacione el auto lejos del lugar de Waverlytrabajo o Prospectestudio.  Concurra a un gimnasio o tome clases de gimnasia.  Comience con 5  10 minutos de actividad fsica por da. Ejercite hasta 30 minutos, 4 a 6 das por [address].  Utilice zapatos que tengan un buen soporte y ropas cmodas.  Elongue antes y despus de Company secretaryejercitar.  Ejercite hasta que aumente la respiracin y el corazn palpite rpido.  Beba agua extra cuando ejercite.  No haga ejercicio Firefighterhasta lastimarse, sentirse mareado o que le falte mucho el aire. La actividad fsica puede quemar alrededor de 150 caloras.  Correr 20 cuadras en 15 minutos.  Jugar vley durante 45 a 60 minutos.  Limpiar y encerar el auto durante 45 a 60 minutos.  Jugar ftbol americano de toque.  Caminar 25 cuadras en 35 minutos.  Empujar un cochecito 20 cuadras en 30 minutos.  Jugar baloncesto durante 30 minutos.  Rastrillar hojas secas durante 30 minutos.  Andar en bicicleta 80 cuadras en 30 minutos.  Caminar 30 cuadras en 30 minutos.  Bailar durante 30 minutos.  Quitar la nieve con una pala durante 15 minutos.  Nadar vigorosamente durante 20 minutos.  Subir escaleras durante 15 minutos.  Andar en bicicleta 60 cuadras durante  15 minutos.  Arreglar el jardn entre 30 y 45 minutos.  Saltar a la soga durante 15 minutos.  Limpiar vidrios o pisos durante 45 a 60 minutos. Document Released: 08/23/2010 Document Revised: 08/11/2011 [facility] Patient Information 2015 [place]. This information is not intended to replace advice given to you by your health care provider. Make sure you discuss any questions you have with your health care provider.

## 2014-08-03 NOTE — Progress Notes (Signed)
    Subjective    Christine Pineda is a 49 y.o. female that presents for a physical  Concerns: None. No vaginal discharge or bleeding. She is adherent with metformin and pravastatin. She eats a lot of tortillas and rice. She eats chicken, pork and red meat but does not usually fry foods.  Patient's last menstrual period was 07/31/2014 (exact date).  Past Medical History  Diagnosis Date  . Diabetes mellitus without complication 2008   Past Surgical History  Procedure Laterality Date  . Cesarean section      for macrosomia  . Tubal ligation Bilateral 06/02/2006    [facility] however, can't find record   Family History  Problem Relation Age of Onset  . Diabetes Father    History  Substance Use Topics  . Smoking status: Never Smoker   . Smokeless tobacco: Not on file  . Alcohol Use: Not on file    No Known Allergies  No orders of the defined types were placed in this encounter.    ROS  Per HPI   Objective   LMP 07/07/2014  General: Well appearing female, no distress HEENT: PERRL, TMs clear with some obstruction by cerumen, oropharynx clear, moist mucous membranes Respiratory/Chest: Clear to auscultation bilaterally, breasts symmetrical with no visual abnormalities, no breast nodules felton exam and no nipple discharge Cardiovascular: Regular rate and rhythm, no murmur, rubs or gallops Gastrointestinal: Soft, obese, non-tender, non-distended, no organomegaly Genitourinary: No vaginal lesions, cervix visible with fresh blood eminating from os. No visible cervical lesions. No CMT and no ovarian fullness    Musculoskeletal: Moves extremities spontaneously, foot exam performed Neuro: Alert, oriented, CN intact, gait normal  Assessment and Plan    1. Well adult exam 2. Obesity, morbid  Labs: CBC w/diff, Cmet, Lipid panel, A1c  STI screening: GC/chlamydia, HIV, RPR  Pap smear  Discussed weight loss goals, including change in diet/exercise  habits  Follow-up in 3 months

## 2014-08-04 LAB — CBC WITH DIFFERENTIAL/PLATELET
BASOS ABS: 0 10*3/uL (ref 0.0–0.1)
BASOS PCT: 0 % (ref 0–1)
EOS ABS: 0 10*3/uL (ref 0.0–0.7)
Eosinophils Relative: 0 % (ref 0–5)
HCT: 37 % (ref 36.0–46.0)
Hemoglobin: 11.7 g/dL — ABNORMAL LOW (ref 12.0–15.0)
Lymphocytes Relative: 22 % (ref 12–46)
Lymphs Abs: 1.5 10*3/uL (ref 0.7–4.0)
MCH: 28.6 pg (ref 26.0–34.0)
MCHC: 31.6 g/dL (ref 30.0–36.0)
MCV: 90.5 fL (ref 78.0–100.0)
MONO ABS: 0.3 10*3/uL (ref 0.1–1.0)
MPV: 11.3 fL (ref 8.6–12.4)
Monocytes Relative: 5 % (ref 3–12)
Neutro Abs: 5 10*3/uL (ref 1.7–7.7)
Neutrophils Relative %: 73 % (ref 43–77)
PLATELETS: 281 10*3/uL (ref 150–400)
RBC: 4.09 MIL/uL (ref 3.87–5.11)
RDW: 13.6 % (ref 11.5–15.5)
WBC: 6.9 10*3/uL (ref 4.0–10.5)

## 2014-08-04 LAB — CERVICOVAGINAL ANCILLARY ONLY
CHLAMYDIA, DNA PROBE: NEGATIVE
NEISSERIA GONORRHEA: NEGATIVE

## 2014-08-04 LAB — CYTOLOGY - PAP

## 2014-08-04 LAB — RPR

## 2014-08-04 LAB — HIV ANTIBODY (ROUTINE TESTING W REFLEX): HIV 1&2 Ab, 4th Generation: NONREACTIVE

## 2014-08-08 ENCOUNTER — Encounter: Payer: Self-pay | Admitting: Family Medicine

## 2014-09-18 ENCOUNTER — Other Ambulatory Visit: Payer: Self-pay | Admitting: *Deleted

## 2014-09-18 ENCOUNTER — Telehealth: Payer: Self-pay | Admitting: Family Medicine

## 2014-09-18 DIAGNOSIS — E119 Type 2 diabetes mellitus without complications: Secondary | ICD-10-CM

## 2014-09-18 NOTE — Telephone Encounter (Signed)
Patient [facility] refill for metformin 1000 mg [facility]. Please, follow up with Patient (Spanish).

## 2014-09-18 NOTE — Telephone Encounter (Signed)
Patient should still have 3 refills left

## 2014-09-19 MED ORDER — METFORMIN HCL 1000 MG PO TABS: 1000.0000 mg | ORAL_TABLET | Freq: Two times a day (BID) | ORAL | Status: DC

## 2014-10-02 ENCOUNTER — Other Ambulatory Visit: Payer: Self-pay | Admitting: *Deleted

## 2014-10-02 DIAGNOSIS — E119 Type 2 diabetes mellitus without complications: Secondary | ICD-10-CM

## 2015-07-04 ENCOUNTER — Ambulatory Visit: Payer: Self-pay

## 2015-08-27 ENCOUNTER — Encounter: Payer: Self-pay | Admitting: Family Medicine

## 2015-08-27 ENCOUNTER — Other Ambulatory Visit: Payer: Self-pay

## 2015-08-27 ENCOUNTER — Ambulatory Visit (INDEPENDENT_AMBULATORY_CARE_PROVIDER_SITE_OTHER): Payer: Self-pay | Admitting: Family Medicine

## 2015-08-27 VITALS — BP 122/69 | HR 82 | Temp 98.4°F | Ht 66.0 in | Wt 215.4 lb

## 2015-08-27 DIAGNOSIS — R109 Unspecified abdominal pain: Secondary | ICD-10-CM

## 2015-08-27 DIAGNOSIS — Z1239 Encounter for other screening for malignant neoplasm of breast: Secondary | ICD-10-CM

## 2015-08-27 DIAGNOSIS — Z23 Encounter for immunization: Secondary | ICD-10-CM

## 2015-08-27 DIAGNOSIS — Z1231 Encounter for screening mammogram for malignant neoplasm of breast: Secondary | ICD-10-CM

## 2015-08-27 DIAGNOSIS — E119 Type 2 diabetes mellitus without complications: Secondary | ICD-10-CM

## 2015-08-27 LAB — POCT GLYCOSYLATED HEMOGLOBIN (HGB A1C): Hemoglobin A1C: 8

## 2015-08-27 NOTE — Patient Instructions (Signed)
Thank you for coming to see me today. It was a pleasure. Today we talked about:   Diabetes: Your A1C was 8.0. I would like you to be less than 7. No changes to your metformin since you are taking 2g daily. We discussed starting a new medication but you have opted to try and change your diet (including decreasing carbohydrate intake and cutting out soda and sweet tea).   Please make an appointment to see me in three months for follow-up of your diabetes.  If you have any questions or concerns, please do not hesitate to call the office at [phone].  Sincerely,  Jacquelin Hawkingalph Conny Situ, MD

## 2015-08-27 NOTE — Assessment & Plan Note (Signed)
Patient's A1C not at goal. Currently at 8. Patient opting to try weight loss before starting a new medication. Discussed decreasing carbs and sugary drinks. Follow-up in 3 months

## 2015-08-27 NOTE — Progress Notes (Signed)
    Subjective    Christine Pineda is a 50 y.o. female that presents for a follow-up visit for:   Interpreter: Nettie ElmSylvia [phone]  1. Diabetes: Patient is adherent with metformin 1000mg  BID. Blood sugars are generally in the 130 range. She reports no hypoglycemic episodes. Diet includes high carbohydrate meals, like carbohydrates and she also drinks "a lot of soda."   2. Right sided flank pain: symptoms started three weeks ago. She describes the pain as a pinching pain, non-radiating and occuring about two times per week and lasting seconds. She feels it more when she stands. She has no associated dysuria, frequency, or hesitancy. No trauma. She works in [employer] and has to carry things and place things up in high places.  Social History  Substance Use Topics  . Smoking status: Never Smoker   . Smokeless tobacco: None  . Alcohol Use: No    No Known Allergies  No orders of the defined types were placed in this encounter.    ROS  Per HPI   Objective   BP 122/69 mmHg  Pulse 82  Temp(Src) 98.4 F (36.9 C) (Oral)  Ht 5\' 6"  (1.676 m)  Wt 215 lb 6.4 oz (97.705 kg)  BMI 34.78 kg/m2  LMP 08/14/2015  Vital signs reviewed  General: Well appearing, no distress Musculoskeletal: Patient without tenderness. Full ROM. No CVA tenderness  Assessment and Plan    DM type 2 (diabetes mellitus, type 2) Patient's A1C not at goal. Currently at 8. Patient opting to try weight loss before starting a new medication. Discussed decreasing carbs and sugary drinks. Follow-up in 3 months   Right flank pain Will watch for now.

## 2015-08-28 LAB — MICROALBUMIN, URINE

## 2015-09-18 ENCOUNTER — Ambulatory Visit
Admission: RE | Admit: 2015-09-18 | Discharge: 2015-09-18 | Disposition: A | Discharge status: Home / Self Care | Payer: No Typology Code available for payment source | Source: Ambulatory Visit

## 2015-09-18 DIAGNOSIS — Z1231 Encounter for screening mammogram for malignant neoplasm of breast: Secondary | ICD-10-CM

## 2015-12-26 ENCOUNTER — Telehealth: Payer: Self-pay | Admitting: Internal Medicine

## 2015-12-26 DIAGNOSIS — E119 Type 2 diabetes mellitus without complications: Secondary | ICD-10-CM

## 2015-12-26 NOTE — Telephone Encounter (Signed)
Patient asks PCP refill metformin 1000 mg to be sent to [facility]. She has an appointment on August 11.Please, follow up (Spanish).

## 2015-12-27 MED ORDER — METFORMIN HCL 1000 MG PO TABS: 1000.0000 mg | ORAL_TABLET | Freq: Two times a day (BID) | ORAL | 3 refills | Status: DC

## 2015-12-27 NOTE — Telephone Encounter (Signed)
Pt informed using pacific interpreters Mickle Mallory, [phone]). Sunday Spillers, CMA

## 2015-12-27 NOTE — Telephone Encounter (Signed)
Metformin refill sent to [facility] pharmacy. Please call patient to let her know (needs Spanish).   Marcy Siren, D.O. 12/27/2015, 8:44 AM PGY-2, [facility]

## 2016-01-09 ENCOUNTER — Ambulatory Visit: Payer: Self-pay | Attending: Internal Medicine

## 2016-01-11 ENCOUNTER — Ambulatory Visit (INDEPENDENT_AMBULATORY_CARE_PROVIDER_SITE_OTHER): Payer: Self-pay | Admitting: Internal Medicine

## 2016-01-11 ENCOUNTER — Encounter: Payer: Self-pay | Admitting: Internal Medicine

## 2016-01-11 VITALS — BP 110/70 | HR 71 | Temp 98.3°F | Ht 66.0 in | Wt 214.0 lb

## 2016-01-11 DIAGNOSIS — E119 Type 2 diabetes mellitus without complications: Secondary | ICD-10-CM

## 2016-01-11 LAB — POCT GLYCOSYLATED HEMOGLOBIN (HGB A1C): HEMOGLOBIN A1C: 7.6

## 2016-01-11 MED ORDER — AGAMATRIX PRESTO PRO METER DEVI: 1.0000 | Freq: Every day | 0 refills | Status: DC

## 2016-01-11 NOTE — Progress Notes (Signed)
   Subjective:    Christine Pineda - 50 y.o. female MRN [id number]  Date of birth: February 11, 1966  HPI  Christine Pineda is here for follow up of chronic medical conditions.  Diabetes mellitus, Type 2 Disease Monitoring Blood Sugar Ranges: Fasting - 102-103; 1-2hr PP 120-130. (CBGs are reported from recall, patient without a log). Denies ever having CBG >130. Meter broke recently so has not checked CBGs for past 2-3 days.  Polyuria: No  Visual problems: no   Urine Microalbumin: Microalbumin <0.2 (2017)   Last A1C: 8.0  Medications: Metformin 1000 mg BID  Medication Compliance: yes  Medication Side Effects Hypoglycemia: no   Preventitive Health Care Eye Exam: Not up to date.  Foot Exam: Performed at [facility] today   Patient reports that she is starting to decrease carb intake and sugary drinks. Able to name that bread and soda have a lot of carbs.     Health Maintenance Due  Topic Date Due  . PNEUMOCOCCAL POLYSACCHARIDE VACCINE (1) 06/30/1969  . TETANUS/TDAP  06/30/1986  . FOOT EXAM  08/25/2013  . OPHTHALMOLOGY EXAM  02/16/2014  . HEMOGLOBIN A1C  11/27/2015  . INFLUENZA VACCINE  01/01/2016    -  reports that she has never smoked. She does not have any smokeless tobacco history on file. - Review of Systems: Per HPI. - Past Medical History: Patient Active Problem List   Diagnosis Date Noted  . Well woman exam without gynecological exam 02/20/2013  . DM type 2 (diabetes mellitus, type 2) (HCC) 06/23/2012   - Medications: reviewed and updated    Objective:   Physical Exam BP 110/70   Pulse 71   Temp 98.3 F (36.8 C) (Oral)   Ht 5\' 6"  (1.676 m)   Wt 214 lb (97.1 kg)   LMP 12/13/2015   SpO2 99%   BMI 34.54 kg/m  Gen: NAD, alert, cooperative with exam, well-appearing CV: RRR, good S1/S2, no murmur, no edema, capillary refill brisk  Resp: CTABL, no wheezes, non-labored  Diabetic Foot  Check -  Appearance - no lesions, ulcers or calluses Skin - no unusual pallor or redness Monofilament testing - normal bilaterally  Right - Great toe, medial, central, lateral ball and posterior foot intact Left - Great toe, medial, central, lateral ball and posterior foot intact     Assessment & Plan:   DM type 2 (diabetes mellitus, type 2) Improvement in A1C since previous visit. Still above goal A1C <7.0. Patient has made lifestyle changes since previous visit and may be able to obtain goal with further diet/exercise.  -continue metformin 1000 mg BID  -Rx for meter given and instructed patient to [facility] to [facility] per Koval's suggestion (pt has orange card) -encouragement for continued diet and exercise  -f/u in 3 months for repeat A1C -if repeat A1C worsened or not improved would consider additional pharmacologic therapy     Marcy Sirenatherine Judithann Villamar, D.O. 01/11/2016, 9:34 AM PGY-2, [facility]

## 2016-01-11 NOTE — Assessment & Plan Note (Signed)
Improvement in A1C since previous visit. Still above goal A1C <7.0. Patient has made lifestyle changes since previous visit and may be able to obtain goal with further diet/exercise.  -continue metformin 1000 mg BID  -Rx for meter given and instructed patient to take to [facility] per Koval's suggestion (pt has orange card) -encouragement for continued diet and exercise  -f/u in 3 months for repeat A1C -if repeat A1C worsened or not improved would consider additional pharmacologic therapy

## 2016-05-12 ENCOUNTER — Ambulatory Visit: Payer: Self-pay | Attending: Internal Medicine

## 2016-12-10 ENCOUNTER — Ambulatory Visit (INDEPENDENT_AMBULATORY_CARE_PROVIDER_SITE_OTHER): Payer: Self-pay | Admitting: Physician Assistant

## 2016-12-10 ENCOUNTER — Encounter (INDEPENDENT_AMBULATORY_CARE_PROVIDER_SITE_OTHER): Payer: Self-pay | Admitting: Physician Assistant

## 2016-12-10 VITALS — BP 111/64 | HR 74 | Temp 98.0°F | Ht 65.5 in | Wt 204.2 lb

## 2016-12-10 DIAGNOSIS — E119 Type 2 diabetes mellitus without complications: Secondary | ICD-10-CM

## 2016-12-10 DIAGNOSIS — Z1239 Encounter for other screening for malignant neoplasm of breast: Secondary | ICD-10-CM

## 2016-12-10 DIAGNOSIS — Z23 Encounter for immunization: Secondary | ICD-10-CM

## 2016-12-10 DIAGNOSIS — Z1231 Encounter for screening mammogram for malignant neoplasm of breast: Secondary | ICD-10-CM

## 2016-12-10 LAB — POCT GLYCOSYLATED HEMOGLOBIN (HGB A1C): Hemoglobin A1C: 6.9

## 2016-12-10 MED ORDER — METFORMIN HCL 1000 MG PO TABS: 1000.0000 mg | ORAL_TABLET | Freq: Two times a day (BID) | ORAL | 3 refills | Status: DC

## 2016-12-10 MED ORDER — GLUCOSE BLOOD VI STRP: ORAL_STRIP | 12 refills | Status: DC

## 2016-12-10 MED ORDER — ACCU-CHEK SOFT TOUCH LANCETS MISC: 12 refills | Status: DC

## 2016-12-10 NOTE — Progress Notes (Signed)
Subjective:  Patient ID: Christine Pineda, female    DOB: [birth year]  Age: 51 y.o. MRN: [id number]  CC: establish care for DM  HPI Christine Pineda is a 51 y.o. female with a PMH of DM2 presents as a new patient to establish care for DM2. Last A1c 7.6% on 01/11/2016. Has been taking Metformin 1000 mg BID daily. Tries to watch her diet. Does not exercise regularly. Has not been to the ophthalmologist in over a year. Denies polydipsia, polyuria, polyphagia, visual blurring, fatigue, tingling, or numbness. Glucometer readings from 104 - 160. Generally feels well and does not endorse any other symptoms or complaints. Requests an order for screening mammogram. No breast issues. Trying to stay proactive.    Outpatient Medications Prior to Visit  Medication Sig Dispense Refill  . Blood Glucose Monitoring Suppl (AGAMATRIX PRESTO PRO METER) DEVI 1 Device by Does not apply route daily. 1 Device 0  . metFORMIN (GLUCOPHAGE) 1000 MG tablet Take 1 tablet (1,000 mg total) by mouth 2 (two) times daily with a meal. 180 tablet 3   No facility-administered medications prior to visit.      ROS Review of Systems  Constitutional: Negative for chills, fever and malaise/fatigue.  Eyes: Negative for blurred vision.  Respiratory: Negative for shortness of breath.   Cardiovascular: Negative for chest pain and palpitations.  Gastrointestinal: Negative for abdominal pain and nausea.  Genitourinary: Negative for dysuria and hematuria.  Musculoskeletal: Negative for joint pain and myalgias.  Skin: Negative for rash.  Neurological: Negative for tingling and headaches.  Psychiatric/Behavioral: Negative for depression. The patient is not nervous/anxious.     Objective:  BP 111/64 (BP Location: Left Arm, Patient Position: Sitting, Cuff Size: Large)   Pulse 74   Temp 98 F (36.7 C) (Oral)   Ht 5' 5.5" (1.664 m)   Wt 204 lb 3.2 oz (92.6 kg)   LMP 12/03/2016 (Exact Date)   SpO2 99%   BMI 33.46 kg/m    BP/Weight 12/10/2016 01/11/2016 08/27/2015  Systolic BP 111 110 122  Diastolic BP 64 70 69  Wt. (Lbs) 204.2 214 215.4  BMI 33.46 34.54 34.78      Physical Exam  Constitutional: She is oriented to person, place, and time.  Well developed, overweight, NAD, polite  HENT:  Head: Normocephalic and atraumatic.  No oral thrush  Eyes: Conjunctivae are normal. No scleral icterus.  Neck: Normal range of motion. Neck supple. No thyromegaly present.  Cardiovascular: Normal rate, regular rhythm and normal heart sounds.   Pulmonary/Chest: Effort normal and breath sounds normal.  Abdominal: Soft. Bowel sounds are normal. There is no tenderness.  Musculoskeletal: She exhibits no edema.  Neurological: She is alert and oriented to person, place, and time. No cranial nerve deficit. Coordination normal.  Skin: Skin is warm and dry. No rash noted. No erythema. No pallor.  Psychiatric: She has a normal mood and affect. Her behavior is normal. Thought content normal.  Vitals reviewed.    Assessment & Plan:   1. Type 2 diabetes mellitus without complication, without long-term current use of insulin (HCC) - Refill Lancets (ACCU-CHEK SOFT TOUCH) lancets; Use as instructed  Dispense: 100 each; Refill: 12 - Refill glucose blood (AGAMATRIX JAZZ TEST) test strip; Use as instructed  Dispense: 100 each; Refill: 12 - Refill metFORMIN (GLUCOPHAGE) 1000 MG tablet; Take 1 tablet (1,000 mg total) by mouth 2 (two) times daily with a meal.  Dispense: 180 tablet; Refill: 3 - HgB A1c - Ambulatory referral to Ophthalmology - Microalbumin/Creatinine Ratio, Urine  2. Screening for breast cancer - MS DIGITAL SCREENING BILATERAL; Future  3. Need for Tdap vaccination - Tdap vaccine greater than or equal to 7yo IM  4. Need for prophylactic vaccination against Streptococcus pneumoniae (pneumococcus) - Pneumococcal conjugate vaccine 13-valent   Meds ordered this encounter  Medications  . Lancets (ACCU-CHEK SOFT  TOUCH) lancets    Sig: Use as instructed    Dispense:  100 each    Refill:  12    Order Specific Question:   Supervising Provider    Answer:   Quentin Angst [[id number]]  . glucose blood (AGAMATRIX JAZZ TEST) test strip    Sig: Use as instructed    Dispense:  100 each    Refill:  12    Order Specific Question:   Supervising Provider    Answer:   Quentin Angst [[id number]]  . metFORMIN (GLUCOPHAGE) 1000 MG tablet    Sig: Take 1 tablet (1,000 mg total) by mouth 2 (two) times daily with a meal.    Dispense:  180 tablet    Refill:  3    Order Specific Question:   Supervising Provider    Answer:   Quentin Angst [id number]    Follow-up: 6 months.  Loletta Specter PA

## 2016-12-10 NOTE — Patient Instructions (Signed)
Diabetes mellitus y actividad fsica  (Diabetes Mellitus and Exercise)  Hacer actividad fsica habitualmente es importante para el estado de salud general, en especial si tiene diabetes (diabetes mellitus). La actividad fsica no solo se reduce a bajar de peso. Aporta muchos beneficios para la salud, como aumentar la fuerza muscular y la densidad sea, y reducir las grasas corporales y el estrs. Esto mejora el estado fsico, la flexibilidad y la resistencia, y todo ello redunda en un mejor estado de salud general.  La actividad fsica tiene beneficios adicionales para los diabticos, entre ellos:   Disminuye el apetito.   Ayuda a bajar y mantener la glucemia bajo control.   Baja la presin arterial.   Ayuda a controlar las cantidades de sustancias grasas (lpidos) en la sangre, como el colesterol y los triglicridos.   Mejora la respuesta del cuerpo a la insulina (optimizacin de la sensibilidad a la insulina).   Reduce la cantidad de insulina que el cuerpo necesita.   Reduce el riesgo de sufrir cardiopata coronaria de la siguiente forma:  ? Baja los niveles de colesterol y triglicridos.  ? Aumenta los niveles de colesterol bueno.  ? Disminuye la glucemia.  QU ES EL PLAN DE ACTIVIDADES?  El mdico o un educador para la diabetes certificado pueden ayudarlo a elaborar un plan respecto del tipo y de la frecuencia de actividad fsica (plan de actividades) adecuado para usted. Asegrese de lo siguiente:   Haga por lo menos 150minutos semanales de ejercicios de intensidad moderada o vigorosa. Estos podran ser caminatas dinmicas, ciclismo o gimnasia acutica.  ? Haga ejercicios de elongacin y de fortalecimiento, como yoga o levantamiento de pesas, por lo menos 2veces por semana.  ? Reparta la actividad en al menos 3das de la semana.   Haga algn tipo de actividad fsica todos los das.  ? No deje pasar ms de 2das seguidos sin hacer algn tipo de actividad fsica.  ? No permanezca inactivo durante  ms de 90minutos seguidos. Tmese descansos frecuentes para caminar o estirarse.   Elija un tipo de ejercicio o de actividad que disfrute y establezca objetivos realistas.   Comience lentamente y aumente de manera gradual la intensidad del ejercicio con el correr del tiempo.  QU DEBO SABER ACERCA DEL CONTROL DE LA DIABETES?   Contrlese la glucemia antes y despus de ejercitarse.  ? Si la glucemia supera los 240mg/dl (13,3mmol/l) antes de ejercitarse, hgase un control de la orina para detectar la presencia de cetonas. Si tiene cetonas en la orina, no se ejercite hasta que la glucemia se normalice.   Conozca los sntomas de la glucemia baja (hipoglucemia) y aprenda cmo tratarla. El riesgo de tener hipoglucemia aumenta durante y despus de hacer actividad fsica. Los sntomas frecuentes de hipoglucemia pueden incluir los siguientes:  ? Hambre.  ? Ansiedad.  ? Sudoracin y piel hmeda.  ? Confusin.  ? Mareos o sensacin de desvanecimiento.  ? Aumento de la frecuencia cardaca o palpitaciones.  ? Visin borrosa.  ? Hormigueo o adormecimiento alrededor de la boca, los labios o la lengua.  ? Temblores o sacudidas.  ? Irritabilidad.   Tenga una colacin de carbohidratos de accin rpida disponible antes, durante y despus de ejercitarse, a fin de evitar o tratar la hipoglucemia.   Evite inyectarse insulina en las zonas del cuerpo que ejercitar. Por ejemplo, evite inyectarse insulina en:  ? Los brazos, si juega al tenis.  ? Las piernas, si corre.   Lleve registros de sus hbitos de actividad fsica.   Esto puede ayudarlos a usted y al mdico a adaptar el plan de control de la diabetes segn sea necesario. Escriba los siguientes datos:  ? Los alimentos que consume antes y despus de hacer actividad fsica.  ? Los niveles de glucosa en la sangre antes y despus de hacer ejercicios.  ? El tipo y cantidad de actividad fsica que realiza.  ? Cuando se prev que la insulina alcance su valor mximo, si usa insulina.  No haga actividad fsica en los momentos en que insulina alcanza su valor mximo.   Cuando comience un ejercicio o una actividad nuevos, trabaje con el mdico para asegurarse de que la actividad sea segura para usted y para ajustar la insulina, los medicamentos o la ingesta de alimentos segn sea necesario.   Beba gran cantidad de agua mientras hace ejercicios para evitar la deshidratacin o los golpes de calor. Beba suficiente lquido para mantener la orina clara o de color amarillo plido.  Esta informacin no tiene como fin reemplazar el consejo del mdico. Asegrese de hacerle al mdico cualquier pregunta que tenga.  Document Released: 06/08/2007 Document Revised: 09/10/2015 Document Reviewed: 10/29/2015  Elsevier Interactive Patient Education  2018 [employer]

## 2016-12-11 LAB — MICROALBUMIN / CREATININE URINE RATIO
Creatinine, Urine: 133.9 mg/dL
MICROALB/CREAT RATIO: 3.7 mg/g{creat} (ref 0.0–30.0)
MICROALBUM., U, RANDOM: 4.9 ug/mL

## 2016-12-24 ENCOUNTER — Ambulatory Visit: Payer: Self-pay

## 2016-12-26 ENCOUNTER — Ambulatory Visit: Payer: Self-pay | Attending: Physician Assistant

## 2017-01-09 ENCOUNTER — Ambulatory Visit
Admission: RE | Admit: 2017-01-09 | Discharge: 2017-01-09 | Disposition: A | Discharge status: Home / Self Care | Payer: No Typology Code available for payment source | Source: Ambulatory Visit | Attending: Physician Assistant | Admitting: Physician Assistant

## 2017-01-09 DIAGNOSIS — Z1239 Encounter for other screening for malignant neoplasm of breast: Secondary | ICD-10-CM

## 2018-02-13 ENCOUNTER — Other Ambulatory Visit (INDEPENDENT_AMBULATORY_CARE_PROVIDER_SITE_OTHER): Payer: Self-pay | Admitting: Physician Assistant

## 2018-02-13 DIAGNOSIS — E119 Type 2 diabetes mellitus without complications: Secondary | ICD-10-CM

## 2018-02-15 ENCOUNTER — Telehealth (INDEPENDENT_AMBULATORY_CARE_PROVIDER_SITE_OTHER): Payer: Self-pay | Admitting: Physician Assistant

## 2018-02-15 NOTE — Telephone Encounter (Signed)
This patient has not been seen since July 2018. Please see if she can be scheduled for an office visit.

## 2018-02-15 NOTE — Telephone Encounter (Signed)
Patient called requesting a medication refill for    metFORMIN (GLUCOPHAGE) 1000 MG tablet  [facility], [place] - [address]  Please Advice [phone]

## 2018-02-15 NOTE — Telephone Encounter (Signed)
FWD to PCP. Armend Hochstatter S Marney Treloar, CMA  

## 2018-02-15 NOTE — Telephone Encounter (Signed)
FWD to PCP. Tempestt S Roberts, CMA  

## 2018-02-16 NOTE — Telephone Encounter (Signed)
Patient has an appointment schedule on Oct 10 @11 :10am.  Thank you Christine Pineda

## 2018-02-17 ENCOUNTER — Other Ambulatory Visit (INDEPENDENT_AMBULATORY_CARE_PROVIDER_SITE_OTHER): Payer: Self-pay | Admitting: Physician Assistant

## 2018-02-22 ENCOUNTER — Other Ambulatory Visit: Payer: Self-pay | Admitting: Obstetrics and Gynecology

## 2018-02-22 DIAGNOSIS — Z1231 Encounter for screening mammogram for malignant neoplasm of breast: Secondary | ICD-10-CM

## 2018-03-08 ENCOUNTER — Ambulatory Visit: Payer: Self-pay | Attending: Physician Assistant

## 2018-03-11 ENCOUNTER — Ambulatory Visit (INDEPENDENT_AMBULATORY_CARE_PROVIDER_SITE_OTHER): Payer: Self-pay | Admitting: Physician Assistant

## 2018-03-11 ENCOUNTER — Other Ambulatory Visit (HOSPITAL_COMMUNITY)
Admission: RE | Admit: 2018-03-11 | Discharge: 2018-03-11 | Disposition: A | Discharge status: Home / Self Care | Payer: Self-pay | Source: Ambulatory Visit | Attending: Physician Assistant | Admitting: Physician Assistant

## 2018-03-11 ENCOUNTER — Other Ambulatory Visit: Payer: Self-pay

## 2018-03-11 ENCOUNTER — Encounter (INDEPENDENT_AMBULATORY_CARE_PROVIDER_SITE_OTHER): Payer: Self-pay | Admitting: Physician Assistant

## 2018-03-11 VITALS — BP 108/70 | HR 62 | Temp 97.9°F | Ht 65.5 in | Wt 199.0 lb

## 2018-03-11 DIAGNOSIS — Z124 Encounter for screening for malignant neoplasm of cervix: Secondary | ICD-10-CM | POA: Insufficient documentation

## 2018-03-11 DIAGNOSIS — B3731 Acute candidiasis of vulva and vagina: Secondary | ICD-10-CM

## 2018-03-11 DIAGNOSIS — E119 Type 2 diabetes mellitus without complications: Secondary | ICD-10-CM | POA: Insufficient documentation

## 2018-03-11 DIAGNOSIS — Z1211 Encounter for screening for malignant neoplasm of colon: Secondary | ICD-10-CM

## 2018-03-11 DIAGNOSIS — B373 Candidiasis of vulva and vagina: Secondary | ICD-10-CM

## 2018-03-11 DIAGNOSIS — Z23 Encounter for immunization: Secondary | ICD-10-CM

## 2018-03-11 DIAGNOSIS — L918 Other hypertrophic disorders of the skin: Secondary | ICD-10-CM

## 2018-03-11 DIAGNOSIS — Z0000 Encounter for general adult medical examination without abnormal findings: Secondary | ICD-10-CM | POA: Insufficient documentation

## 2018-03-11 LAB — POCT GLYCOSYLATED HEMOGLOBIN (HGB A1C): Hemoglobin A1C: 6.5 % — AB (ref 4.0–5.6)

## 2018-03-11 MED ORDER — FLUCONAZOLE 150 MG PO TABS: 150.0000 mg | ORAL_TABLET | Freq: Once | ORAL | 0 refills | Status: AC

## 2018-03-11 MED ORDER — METFORMIN HCL 1000 MG PO TABS: ORAL_TABLET | ORAL | 1 refills | Status: DC

## 2018-03-11 NOTE — Patient Instructions (Signed)
Influenza Virus Vaccine injection (Fluarix) Qu es este medicamento? La VACUNA ANTIGRIPAL ayuda a disminuir el riesgo de contraer la influenza, tambin conocida como la gripe. La vacuna solo ayuda a protegerle contra algunas cepas de influenza. Esta vacuna no ayuda a reducir el riesgo de contraer influenza pandmica H1N1. Este medicamento puede ser utilizado para otros usos; si tiene alguna pregunta consulte con su proveedor de atencin mdica o con su farmacutico. MARCAS COMUNES: Fluarix, Fluzone Qu le debo informar a mi profesional de la salud antes de tomar este medicamento? Necesita saber si usted presenta alguno de los siguientes problemas o situaciones: -trastorno de sangrado como hemofilia -fiebre o infeccin -sndrome de Guillain-Barre u otros problemas neurolgicos -problemas del sistema inmunolgico -infeccin por el virus de la inmunodeficiencia humana (VIH) o SIDA -niveles bajos de plaquetas en la sangre -esclerosis mltiple -una reaccin alrgica o inusual a las vacunas antigripales, a los huevos, protenas de pollo, al ltex, a la gentamicina, a otros medicamentos, alimentos, colorantes o conservantes -si est embarazada o buscando quedar embarazada -si est amamantando a un beb Cmo debo utilizar este medicamento? Esta vacuna se administra mediante inyeccin por va intramuscular. Lo administra un profesional de la salud. Recibir una copia de informacin escrita sobre la vacuna antes de cada vacuna. Asegrese de leer este folleto cada vez cuidadosamente. Este folleto puede cambiar con frecuencia. Hable con su pediatra para informarse acerca del uso de este medicamento en nios. Puede requerir atencin especial. Sobredosis: Pngase en contacto inmediatamente con un centro toxicolgico o una sala de urgencia si usted cree que haya tomado demasiado medicamento. ATENCIN: Este medicamento es solo para usted. No comparta este medicamento con nadie. Qu sucede si me olvido de  una dosis? No se aplica en este caso. Qu puede interactuar con este medicamento? -quimioterapia o radioterapia -medicamentos que suprimen el sistema inmunolgico, tales como etanercept, anakinra, infliximab y adalimumab -medicamentos que tratan o previenen cogulos sanguneos, como warfarina -fenitona -medicamentos esteroideos, como la prednisona o la cortisona -teofilina -vacunas Puede ser que esta lista no menciona todas las posibles interacciones. Informe a su profesional de la salud de todos los productos a base de hierbas, medicamentos de venta libre o suplementos nutritivos que est tomando. Si usted fuma, consume bebidas alcohlicas o si utiliza drogas ilegales, indqueselo tambin a su profesional de la salud. Algunas sustancias pueden interactuar con su medicamento. A qu debo estar atento al usar este medicamento? Informe a su mdico o a su profesional de la salud sobre todos los efectos secundarios que persistan despus de 3 das. Llame a su proveedor de atencin mdica si se presentan sntomas inusuales dentro de las 6 semanas posteriores a la vacunacin. Es posible que todava pueda contraer la gripe, pero la enfermedad no ser tan fuerte como normalmente. No puede contraer la gripe de esta vacuna. La vacuna antigripal no le protege contra resfros u otras enfermedades que pueden causar fiebre. Debe vacunarse cada ao. Qu efectos secundarios puedo tener al utilizar este medicamento? Efectos secundarios que debe informar a su mdico o a su profesional de la salud tan pronto como sea posible: -reacciones alrgicas como erupcin cutnea, picazn o urticarias, hinchazn de la cara, labios o lengua Efectos secundarios que, por lo general, no requieren atencin mdica (debe informarlos a su mdico o a su profesional de la salud si persisten o si son molestos): -fiebre -dolor de cabeza -molestias y dolores musculares -dolor, sensibilidad, enrojecimiento o hinchazn en el lugar de la  inyeccin -cansancio o debilidad Puede ser que esta lista no   menciona todos los posibles efectos secundarios. Comunquese a su mdico por asesoramiento mdico [employer]. Usted puede informar los efectos secundarios a la FDA por telfono al 1-800-FDA-1088. Dnde debo guardar mi medicina? Esta vacuna se administra solamente en clnicas, farmacias, consultorio mdico u otro consultorio de un profesional de la salud y no Teacher, early years/pre en su domicilio. ATENCIN: Este folleto es un resumen. Puede ser que no cubra toda la posible informacin. Si usted tiene preguntas acerca de esta medicina, consulte con su mdico, su farmacutico o su profesional de Radiographer, therapeutic.  2018 Elsevier/Gold Standard (2009-11-20 15:31:40)    Madilyn Fireman antineumoccica de polisacridos: Lo que debe saber (Pneumococcal Polysaccharide Vaccine: What You Need to Know) 1. Por qu vacunarse? La vacunacin puede proteger a los [employer] (y a algunos nios y adultos ms jvenes) de la enfermedad neumoccica. La enfermedad neumoccica es causada por una bacteria que puede contagiarse de [place] persona a otra por contacto directo. Puede provocar infecciones en los odos y tambin infecciones ms graves en:  los pulmones (neumona),  la sangre (bacteriemia), y  las membranas que cubren el cerebro y la mdula espinal (meningitis). La meningitis puede causar sordera y dao cerebral, y puede ser mortal. Cualquier persona puede contraer la enfermedad neumoccica, pero los nios menores de 2 aos, las personas con [employer], los adultos mayores de 65 aos y los fumadores son los que corren un mayor riesgo. Cada ao, mueren alrededor de [id number] adultos mayores a causa de la enfermedad [facility] [place]. El tratamiento de las infecciones neumoccicas con penicilina y otros medicamentos era ms efectivo en el pasado. Sin embargo, algunas cepas de la bacteria que causa la enfermedad se han  vuelto resistentes a estos medicamentos. Esto hace que la prevencin de la enfermedad mediante la vacunacin sea an ms importante. 2. Madilyn Fireman antineumoccica de polisacridos (PPSV23) La vacuna antineumoccica de polisacridos (PPSV23) protege contra 23tipos de bacterias neumoccicas. No previene todas las enfermedades neumoccicas. La vacuna PPSV23 se recomienda para las siguientes personas:  Todos los adultos de 65aos en adelante.  Teresita Madura persona de 2a 64aos que tenga un problema de salud a Air cabin crew.  [employer] de 2 a 64 aos que tengan el sistema inmunitario dbil.  Los adultos de 19 a 64 aos que fumen o tengan asma. La [employer]son de las personas solo necesitan una dosis de la vacuna PPSV. Para ciertos grupos de alto riesgo se recomienda una segunda dosis. Las [employer] de 65 aos deben recibir una dosis de la vacuna, aun si recibieron una o ms dosis antes de [employer]. El mdico puede brindarle ms informacin sobre estas recomendaciones. La mayora de los adultos sanos adquiere proteccin 2 a 3semanas despus de haber recibido la vacuna. 3. Algunas personas no deben recibir la vacuna  Teresita Madura persona que haya sufrido una reaccin alrgica potencialmente mortal a la PPSV no debe recibir otra dosis.  Las personas que tengan Programmer, multimedia grave a los componentes de la vacuna PPSV no deben recibirla. Informe a su mdico si sufre de algn tipo de alergia grave.  Teresita Madura persona que est enferma en un grado moderado o grave el da en que est programada la vacunacin, probablemente deba esperar a recuperarse para recibirla. Por lo general, una persona con una enfermedad leve puede vacunarse.  Los nios menores de 2 aos no deben recibir [employer].  No hay pruebas de que la vacuna PPSV sea perjudicial para las mujeres embarazadas o el feto. No obstante, como precaucin, las  mujeres que necesiten aplicarse la vacuna deben hacerlo antes de quedar embarazadas, si es  posible.  4. Riesgos de [place] reaccin a la vacuna Con cualquier medicamento, incluyendo las vacunas, existe la posibilidad de que aparezcan efectos secundarios. Estos son leves y desaparecen por s solos, pero tambin son posibles las reacciones graves. Aproximadamente la mitad de las personas que reciben la PPSV presentan efectos secundarios leves, como enrojecimiento o Art therapist donde se aplic la vacuna, que desaparecen a los [address] Menos de 1 de cada 100personas presenta fiebre, dolores musculares o reacciones locales ms graves. Problemas que podran ocurrir despus de cualquier vacuna:  Las personas a veces se desmayan despus de un procedimiento mdico, incluso despus de recibir Scientist, forensic. Si permanece sentado o recostado durante 15 minutos puede ayudar a [employer] y las lesiones causadas por las cadas. Informe al mdico si se siente mareado, tiene cambios en la visin o zumbidos en los odos.  Algunas personas sienten un dolor intenso en el hombro y tienen dificultad para mover el brazo donde se coloc la vacuna. Esto sucede con muy poca frecuencia.  Cualquier medicamento puede causar una reaccin alrgica grave. Dichas reacciones son Lynnae Sandhoff poco frecuentes con una vacuna (se calcula que menos de 1en un milln de dosis) y se producen unos minutos a unas horas despus de la vacunacin. Al igual que con cualquier Automatic Data, existe una probabilidad remota de que una vacuna cause una lesin grave o la [place]. Se controla permanentemente la seguridad de las vacunas. Para obtener ms informacin, visite: http://floyd.org/. 5. Qu pasa si hay una reaccin grave? A qu signos debo estar atento? Observe todo lo que le preocupe, como signos de una reaccin alrgica grave, fiebre muy alta o comportamiento fuera de lo normal. Los signos de una reaccin alrgica grave pueden incluir ronchas, hinchazn de la cara y la garganta, dificultad para respirar, latidos  cardacos acelerados, mareos y debilidad. Generalmente, estos comenzaran entre unos pocos minutos y algunas horas despus de la vacunacin. Qu debo hacer? Si usted piensa que se trata de una reaccin alrgica grave o de otra emergencia que no puede esperar, llame al 911 o dirjase al hospital ms cercano. Sino, llame a su mdico. Despus, la reaccin debe informarse al [address] de Informacin sobre Efectos Adversos de las [place] (Vaccine Adverse Event Reporting System, VAERS). Su mdico puede presentar este informe, o puede hacerlo usted mismo a travs del sitio web de VAERS, en www.vaers.LAgents.no, o llamando al [phone]. VAERS no brinda recomendaciones mdicas. 6. Cmo puedo obtener ms informacin?  Consulte a su mdico. Este puede darle el prospecto de la vacuna o recomendarle otras fuentes de informacin.  Comunquese con el servicio de salud de su localidad o [address].  Comunquese con los Centros para Air traffic controller y la Prevencin de Child psychotherapist for Disease Control and Prevention , CDC). ? Llame al [phone] (1-800-CDC-INFO), o ? Visite el sitio Environmental manager en [email or web address]. Declaracin de informacin sobre la vacuna antineumoccica de polisacridos de los CDC (09/23/13) Esta informacin no tiene Theme park manager el consejo del mdico. Asegrese de hacerle al mdico cualquier pregunta que tenga. Document Released: 08/15/2008 Document Revised: 06/09/2014 Document Reviewed: 09/26/2013 Elsevier Interactive Patient Education  2017 [employer].

## 2018-03-11 NOTE — Progress Notes (Signed)
Subjective:  Patient ID: Christine Pineda, female    DOB: 05/01/1966  Age: 52 y.o. MRN: [id number]  CC: annual exam  HPI Christine Pineda is a 52 y.o. female with a PMH of DM2 presents for an annual physical exam. Says she is feeling well. She will run out of Metformin refills soon and requests refills. Last A1c 6.9% four months ago. A1c 6.5% today. Endorses a large skin tag on the left lower back that has been growing slowly over the years. Does not endorse any other symptoms or complaints.      Outpatient Medications Prior to Visit  Medication Sig Dispense Refill  . metFORMIN (GLUCOPHAGE) 1000 MG tablet TAKE 1 TABLET BY MOUTH TWICE DAILY WITH  A  MEAL (Patient not taking: Reported on 03/11/2018) 60 tablet 0  . Blood Glucose Monitoring Suppl (AGAMATRIX PRESTO PRO METER) DEVI 1 Device by Does not apply route daily. 1 Device 0  . glucose blood (AGAMATRIX JAZZ TEST) test strip Use as instructed 100 each 12  . Lancets (ACCU-CHEK SOFT TOUCH) lancets Use TID. 100 each 12   No facility-administered medications prior to visit.      ROS Review of Systems  Constitutional: Negative for chills, fever and malaise/fatigue.  Eyes: Negative for blurred vision.  Respiratory: Negative for shortness of breath.   Cardiovascular: Negative for chest pain and palpitations.  Gastrointestinal: Negative for abdominal pain and nausea.  Genitourinary: Negative for dysuria and hematuria.  Musculoskeletal: Negative for joint pain and myalgias.  Skin: Negative for rash.       Growth on lower back  Neurological: Negative for tingling and headaches.  Psychiatric/Behavioral: Negative for depression. The patient is not nervous/anxious.     Objective:  BP 108/70 (BP Location: Left Arm, Patient Position: Sitting, Cuff Size: Large)   Pulse 62   Temp 97.9 F (36.6 C) (Oral)   Ht 5' 5.5" (1.664 m)   Wt 199 lb (90.3 kg)   LMP 02/25/2018 (Approximate)   SpO2 99%   BMI 32.61 kg/m   BP/Weight  03/11/2018 12/10/2016 01/11/2016  Systolic BP 108 111 110  Diastolic BP 70 64 70  Wt. (Lbs) 199 204.2 214  BMI 32.61 33.46 34.54      Physical Exam  Constitutional: She is oriented to person, place, and time.  Well developed, well nourished, NAD, polite  HENT:  Head: Normocephalic and atraumatic.  Mouth/Throat: No oropharyngeal exudate.  Eyes: Pupils are equal, round, and reactive to light. EOM are normal. No scleral icterus.  Neck: Normal range of motion. Neck supple. No thyromegaly present.  Cardiovascular: Normal rate, regular rhythm and normal heart sounds.  Pulmonary/Chest: Effort normal and breath sounds normal.  Abdominal: Soft. Bowel sounds are normal. There is no tenderness.  Genitourinary:  Genitourinary Comments: Thick white clumpy vaginal discharge. Cervix friable, nontender. No adnexal mass or tenderness bilaterally. No uterine mass or tenderness.   Musculoskeletal: Normal range of motion. She exhibits no edema or deformity.  Neurological: She is alert and oriented to person, place, and time. She displays normal reflexes. No cranial nerve deficit or sensory deficit. She exhibits normal muscle tone. Coordination normal.  Skin: Skin is warm and dry. No rash noted. No erythema. No pallor.  Large pedunculated skin tag on left lower back  Psychiatric: She has a normal mood and affect. Her behavior is normal. Thought content normal.  Vitals reviewed.    Assessment & Plan:    1. Annual physical exam - CBC with Differential - Comprehensive metabolic panel - Lipid  panel - TSH  2. Type 2 diabetes mellitus without complication, without long-term current use of insulin (HCC) - HgB A1c 6.5% today. Down from 6.9% four months ago. - Microalbumin / creatinine urine ratio - Refill metFORMIN (GLUCOPHAGE) 1000 MG tablet; TAKE 1 TABLET BY MOUTH TWICE DAILY WITH  A  MEAL  Dispense: 180 tablet; Refill: 1  3. Vaginal yeast infection - Begin fluconazole (DIFLUCAN) 150 MG tablet; Take  1 tablet (150 mg total) by mouth once for 1 dose.  Dispense: 1 tablet; Refill: 0  4. Need for prophylactic vaccination and inoculation against influenza - Flu Vaccine QUAD 6+ mos PF IM (Fluarix Quad PF)  5. Need for pneumococcal vaccination - Pneumococcal polysaccharide vaccine 23-valent greater than or equal to 2yo subcutaneous/IM  6. Screening for colon cancer - Fecal occult blood, imunochemical  7. Screening for cervical cancer - Cytology - PAP(Ruth)  8. Skin tag - Ambulatory referral to Dermatology   Meds ordered this encounter  Medications  . metFORMIN (GLUCOPHAGE) 1000 MG tablet    Sig: TAKE 1 TABLET BY MOUTH TWICE DAILY WITH  A  MEAL    Dispense:  180 tablet    Refill:  1    Needs office visit before any further refills made.    Order Specific Question:   Supervising Provider    Answer:   Hoy Register [[id number]]  . fluconazole (DIFLUCAN) 150 MG tablet    Sig: Take 1 tablet (150 mg total) by mouth once for 1 dose.    Dispense:  1 tablet    Refill:  0    Order Specific Question:   Supervising Provider    Answer:   Hoy Register [[id number]]    Follow-up: Return in about 6 months (around 09/10/2018) for DM2.   Loletta Specter PA

## 2018-03-12 ENCOUNTER — Telehealth (INDEPENDENT_AMBULATORY_CARE_PROVIDER_SITE_OTHER): Payer: Self-pay

## 2018-03-12 ENCOUNTER — Other Ambulatory Visit (INDEPENDENT_AMBULATORY_CARE_PROVIDER_SITE_OTHER): Payer: Self-pay | Admitting: Physician Assistant

## 2018-03-12 DIAGNOSIS — E7841 Elevated Lipoprotein(a): Secondary | ICD-10-CM

## 2018-03-12 LAB — COMPREHENSIVE METABOLIC PANEL
ALT: 9 IU/L (ref 0–32)
AST: 11 IU/L (ref 0–40)
Albumin/Globulin Ratio: 1.5 (ref 1.2–2.2)
Albumin: 4.3 g/dL (ref 3.5–5.5)
Alkaline Phosphatase: 65 IU/L (ref 39–117)
BILIRUBIN TOTAL: 0.2 mg/dL (ref 0.0–1.2)
BUN/Creatinine Ratio: 14 (ref 9–23)
BUN: 8 mg/dL (ref 6–24)
CALCIUM: 9.1 mg/dL (ref 8.7–10.2)
CHLORIDE: 99 mmol/L (ref 96–106)
CO2: 24 mmol/L (ref 20–29)
CREATININE: 0.57 mg/dL (ref 0.57–1.00)
GFR, EST AFRICAN AMERICAN: 125 mL/min/{1.73_m2} (ref >59)
GFR, EST NON AFRICAN AMERICAN: 108 mL/min/{1.73_m2} (ref >59)
GLUCOSE: 108 mg/dL — AB (ref 65–99)
Globulin, Total: 2.9 g/dL (ref 1.5–4.5)
Potassium: 4.5 mmol/L (ref 3.5–5.2)
Sodium: 138 mmol/L (ref 134–144)
Total Protein: 7.2 g/dL (ref 6.0–8.5)

## 2018-03-12 LAB — CBC WITH DIFFERENTIAL/PLATELET
BASOS ABS: 0 10*3/uL (ref 0.0–0.2)
Basos: 1 %
EOS (ABSOLUTE): 0 10*3/uL (ref 0.0–0.4)
Eos: 0 %
Hematocrit: 34.2 % (ref 34.0–46.6)
Hemoglobin: 11.3 g/dL (ref 11.1–15.9)
IMMATURE GRANS (ABS): 0 10*3/uL (ref 0.0–0.1)
IMMATURE GRANULOCYTES: 0 %
LYMPHS: 24 %
Lymphocytes Absolute: 1.8 10*3/uL (ref 0.7–3.1)
MCH: 29.1 pg (ref 26.6–33.0)
MCHC: 33 g/dL (ref 31.5–35.7)
MCV: 88 fL (ref 79–97)
MONOCYTES: 6 %
Monocytes Absolute: 0.5 10*3/uL (ref 0.1–0.9)
NEUTROS PCT: 69 %
Neutrophils Absolute: 5.4 10*3/uL (ref 1.4–7.0)
PLATELETS: 297 10*3/uL (ref 150–450)
RBC: 3.88 x10E6/uL (ref 3.77–5.28)
RDW: 13.7 % (ref 12.3–15.4)
WBC: 7.8 10*3/uL (ref 3.4–10.8)

## 2018-03-12 LAB — LIPID PANEL
Chol/HDL Ratio: 3.5 ratio (ref 0.0–4.4)
Cholesterol, Total: 206 mg/dL — ABNORMAL HIGH (ref 100–199)
HDL: 59 mg/dL (ref >39)
LDL Calculated: 128 mg/dL — ABNORMAL HIGH (ref 0–99)
Triglycerides: 95 mg/dL (ref 0–149)
VLDL CHOLESTEROL CAL: 19 mg/dL (ref 5–40)

## 2018-03-12 LAB — TSH: TSH: 1.75 u[IU]/mL (ref 0.450–4.500)

## 2018-03-12 LAB — MICROALBUMIN / CREATININE URINE RATIO
Creatinine, Urine: 26.2 mg/dL
Microalb/Creat Ratio: <11.5 mg/g creat (ref 0.0–30.0)
Microalbumin, Urine: <3 ug/mL

## 2018-03-12 MED ORDER — ATORVASTATIN CALCIUM 20 MG PO TABS: 20.0000 mg | ORAL_TABLET | Freq: Every day | ORAL | 3 refills | Status: DC

## 2018-03-12 NOTE — Telephone Encounter (Signed)
-----   Message from Loletta Specter, PA-C sent at 03/12/2018  3:13 PM EDT ----- All results normal except for cholesterol which is mildly elevated. I have sent atorvastatin to [facility] at [employer].

## 2018-03-12 NOTE — Telephone Encounter (Signed)
Call placed using pacific interpreter [phone]) error message "that number is busy" and disconnects. Called other number in patient chart and it gives busy signal. Will try calling once more before mailing results. Maryjean Morn, CMA

## 2018-03-16 LAB — CYTOLOGY - PAP
Bacterial vaginitis: NEGATIVE
CANDIDA VAGINITIS: NEGATIVE
Chlamydia: NEGATIVE
Diagnosis: NEGATIVE
Diagnosis: REACTIVE
Neisseria Gonorrhea: NEGATIVE
TRICH (WINDOWPATH): NEGATIVE

## 2018-03-17 ENCOUNTER — Telehealth (INDEPENDENT_AMBULATORY_CARE_PROVIDER_SITE_OTHER): Payer: Self-pay

## 2018-03-17 NOTE — Telephone Encounter (Signed)
Call placed to patient using Pacific interpreter [place] [phone]) patient is aware that pap is negative and labs normal except for mildly elevated cholesterol. Atorvastatin sent to [facility] at [employer]. Patient expressed understanding and did not have any additional questions. Maryjean Morn, CMA

## 2018-03-17 NOTE — Telephone Encounter (Signed)
-----   Message from Roger David Gomez, PA-C sent at 03/12/2018  3:13 PM EDT ----- All results normal except for cholesterol which is mildly elevated. I have sent atorvastatin to [facility] at [place]. 

## 2018-04-09 ENCOUNTER — Ambulatory Visit (INDEPENDENT_AMBULATORY_CARE_PROVIDER_SITE_OTHER): Payer: Self-pay | Admitting: Physician Assistant

## 2018-05-06 ENCOUNTER — Encounter (HOSPITAL_COMMUNITY): Payer: Self-pay | Admitting: *Deleted

## 2018-05-06 ENCOUNTER — Ambulatory Visit (HOSPITAL_COMMUNITY)
Admission: RE | Admit: 2018-05-06 | Discharge: 2018-05-06 | Disposition: A | Discharge status: Home / Self Care | Payer: Self-pay | Source: Ambulatory Visit | Attending: Obstetrics and Gynecology | Admitting: Obstetrics and Gynecology

## 2018-05-06 ENCOUNTER — Ambulatory Visit
Admission: RE | Admit: 2018-05-06 | Discharge: 2018-05-06 | Disposition: A | Discharge status: Home / Self Care | Payer: Self-pay | Source: Ambulatory Visit | Attending: Obstetrics and Gynecology | Admitting: Obstetrics and Gynecology

## 2018-05-06 ENCOUNTER — Encounter (HOSPITAL_COMMUNITY): Payer: Self-pay

## 2018-05-06 VITALS — BP 124/76 | Wt 204.0 lb

## 2018-05-06 DIAGNOSIS — Z1231 Encounter for screening mammogram for malignant neoplasm of breast: Secondary | ICD-10-CM

## 2018-05-06 DIAGNOSIS — Z1239 Encounter for other screening for malignant neoplasm of breast: Secondary | ICD-10-CM

## 2018-05-06 NOTE — Patient Instructions (Signed)
Explained breast self awareness with Christine Pineda. Patient did not need a Pap smear today due to last Pap smear was 03/11/2018. Let her know B[facility]CP will cover Pap smears every 3 years unless has a history of abnormal Pap smears. [facility]. Appointment scheduled for Thursday, May 06, 2018 at 1510. Patient aware of appointment and will be there. Let patient know the Breast Center will follow up with her within the next couple weeks with results of mammogram by letter or phone. Christine Pineda verbalized understanding.  Shy Guallpa, Kathaleen Maserhristine Poll, RN 1:46 PM

## 2018-05-06 NOTE — Progress Notes (Signed)
No complaints today.   Pap Smear: Pap smear not completed today. [facility] and normal. Per patient has no history of an abnormal Pap smear. Last Pap smear result is in Epic.  Physical exam: Breasts Breasts symmetrical. No skin abnormalities bilateral breasts. No nipple retraction bilateral breasts. No nipple discharge bilateral breasts. No lymphadenopathy. No lumps palpated bilateral breasts. No complaints of pain or tenderness on exam. [facility] screening mamm[facility]. Appointment scheduled for Thursday, May 06, 2018 at 1510.        Pelvic/Bimanual No Pap smear completed today since last Pap smear was 03/11/2018. Pap smear not indicated per BCCCP guidelines.   Smoking History: Patient has never smoked.  Patient Navigation: Patient education provided. Access to services provided for patient through BCCCP program. Taxi voucher given to patient for ride to the Breast Center for her mammogram. Spanish interpreter provided.   Colorectal Cancer Screening: Per patient has never had a colonoscopy completed. No complaints today. Patient stated she has a FIT Test at home that she will complete.  Breast and Cervical Cancer Risk Assessment: Patient has no family history of breast cancer, known genetic mutations, or radiation treatment to the chest before age 50. Patient has no history of cervical dysplasia, immunocompromised, or DES exposure in-utero.  Risk Assessment    Risk Scores      05/06/2018   Last edited by: Lynnell DikeHolland, Sabrina H, LPN   5-year risk: 0.7 %   Lifetime risk: 6.4 %         Used Spanish interpreter Natale LayErika McReynolds from [facility].

## 2018-09-09 ENCOUNTER — Ambulatory Visit (INDEPENDENT_AMBULATORY_CARE_PROVIDER_SITE_OTHER): Payer: Self-pay | Admitting: Primary Care

## 2018-09-09 ENCOUNTER — Encounter: Payer: Self-pay | Admitting: Primary Care

## 2018-09-09 ENCOUNTER — Other Ambulatory Visit: Payer: Self-pay

## 2018-09-09 ENCOUNTER — Ambulatory Visit: Payer: Self-pay | Attending: Physician Assistant | Admitting: Primary Care

## 2018-09-09 DIAGNOSIS — E119 Type 2 diabetes mellitus without complications: Secondary | ICD-10-CM

## 2018-09-09 DIAGNOSIS — E7841 Elevated Lipoprotein(a): Secondary | ICD-10-CM

## 2018-09-09 DIAGNOSIS — E1100 Type 2 diabetes mellitus with hyperosmolarity without nonketotic hyperglycemic-hyperosmolar coma (NKHHC): Secondary | ICD-10-CM

## 2018-09-09 MED ORDER — LISINOPRIL 5 MG PO TABS: 5.0000 mg | ORAL_TABLET | Freq: Every day | ORAL | 0 refills | Status: DC

## 2018-09-09 MED ORDER — METFORMIN HCL 1000 MG PO TABS: ORAL_TABLET | ORAL | 0 refills | Status: DC

## 2018-09-09 MED ORDER — ATORVASTATIN CALCIUM 20 MG PO TABS: 20.0000 mg | ORAL_TABLET | Freq: Every day | ORAL | 0 refills | Status: DC

## 2018-09-09 NOTE — Progress Notes (Signed)
Established Patient Office Visit  Subjective:  Patient ID: Christine Pineda, female    DOB: 1966/04/20  Age: 53 y.o. MRN: [id number]  CC:  Chief Complaint  Patient presents with  . Diabetes   Diabetes  She presents for her follow-up diabetic visit. She has type 2 diabetes mellitus. No MedicAlert identification noted. Her disease course has been improving. There are no hypoglycemic associated symptoms. There are no diabetic associated symptoms. There are no hypoglycemic complications. Symptoms are stable. Risk factors for coronary artery disease include diabetes mellitus and obesity. Current diabetic treatment includes diet. She is compliant with treatment all of the time. She is following a diabetic diet. Meal planning includes avoidance of concentrated sweets. She has not had a previous visit with a dietitian. She participates in exercise daily. There is no change in her home blood glucose trend. Her overall blood glucose range is 110-130 mg/dl. An ACE inhibitor/angiotensin II receptor blocker is not being taken. She does not see a podiatrist.Eye exam is not current.    HPI Christine Pineda  Follow up on diabetes.  Past Medical History:  Diagnosis Date  . Diabetes mellitus without complication (HCC) 2008    Past Surgical History:  Procedure Laterality Date  . CESAREAN SECTION     for macrosomia  . TUBAL LIGATION Bilateral 06/02/2006   Performed at [facility], however, can't find record    Family History  Problem Relation Age of Onset  . Diabetes Father   . Breast cancer Neg Hx     Social History   Socioeconomic History  . Marital status: Single    Spouse name: Not on file  . Number of children: Not on file  . Years of education: Not on file  . Highest education level: Not on file  Occupational History  . Not on file  Social Needs  . Financial resource strain: Not on file  . Food insecurity:    Worry: Not on file    Inability: Not on file  .  Transportation needs:    Medical: Not on file    Non-medical: Not on file  Tobacco Use  . Smoking status: Never Smoker  . Smokeless tobacco: Never Used  Substance and Sexual Activity  . Alcohol use: No  . Drug use: No  . Sexual activity: Yes    Birth control/protection: Surgical  Lifestyle  . Physical activity:    Days per week: Not on file    Minutes per session: Not on file  . Stress: Not on file  Relationships  . Social connections:    Talks on phone: Not on file    Gets together: Not on file    Attends religious service: Not on file    Active member of club or organization: Not on file    Attends meetings of clubs or organizations: Not on file    Relationship status: Not on file  . Intimate partner violence:    Fear of current or ex partner: Not on file    Emotionally abused: Not on file    Physically abused: Not on file    Forced sexual activity: Not on file  Other Topics Concern  . Not on file  Social History Narrative  . Not on file    Outpatient Medications Prior to Visit  Medication Sig Dispense Refill  . atorvastatin (LIPITOR) 20 MG tablet Take 1 tablet (20 mg total) by mouth daily. 90 tablet 3  . metFORMIN (GLUCOPHAGE) 1000 MG tablet TAKE 1 TABLET BY MOUTH  TWICE DAILY WITH  A  MEAL 180 tablet 1   No facility-administered medications prior to visit.     No Known Allergies  ROS Review of Systems  Constitutional: Negative.   HENT: Negative.   Respiratory: Negative.   Cardiovascular: Negative.   Gastrointestinal: Negative.   Genitourinary: Negative.   Musculoskeletal: Negative.       Objective:    Physical Exam  There were no vitals taken for this visit. Wt Readings from Last 3 Encounters:  05/06/18 204 lb (92.5 kg)  03/11/18 199 lb (90.3 kg)  12/10/16 204 lb 3.2 oz (92.6 kg)     Health Maintenance Due  Topic Date Due  . OPHTHALMOLOGY EXAM  02/16/2014  . Fecal DNA (Cologuard)  06/30/2017  . HEMOGLOBIN A1C  06/11/2018    There are no  preventive care reminders to display for this patient.  Lab Results  Component Value Date   TSH 1.750 03/11/2018   Lab Results  Component Value Date   WBC 7.8 03/11/2018   HGB 11.3 03/11/2018   HCT 34.2 03/11/2018   MCV 88 03/11/2018   PLT 297 03/11/2018   Lab Results  Component Value Date   NA 138 03/11/2018   K 4.5 03/11/2018   CO2 24 03/11/2018   GLUCOSE 108 (H) 03/11/2018   BUN 8 03/11/2018   CREATININE 0.57 03/11/2018   BILITOT 0.2 03/11/2018   ALKPHOS 65 03/11/2018   AST 11 03/11/2018   ALT 9 03/11/2018   PROT 7.2 03/11/2018   ALBUMIN 4.3 03/11/2018   CALCIUM 9.1 03/11/2018   Lab Results  Component Value Date   CHOL 206 (H) 03/11/2018   Lab Results  Component Value Date   HDL 59 03/11/2018   Lab Results  Component Value Date   LDLCALC 128 (H) 03/11/2018   Lab Results  Component Value Date   TRIG 95 03/11/2018   Lab Results  Component Value Date   CHOLHDL 3.5 03/11/2018   Lab Results  Component Value Date   HGBA1C 6.5 (A) 03/11/2018      Assessment & Plan:   Problem List Items Addressed This Visit    DM type 2 (diabetes mellitus, type 2) (HCC) - Primary   Relevant Medications   atorvastatin (LIPITOR) 20 MG tablet   metFORMIN (GLUCOPHAGE) 1000 MG tablet   lisinopril (PRINIVIL,ZESTRIL) 5 MG tablet   Other Relevant Orders   Ambulatory referral to Ophthalmology   Hemoglobin A1c   Comprehensive metabolic panel    Other Visit Diagnoses    Elevated lipoprotein(a)       Relevant Medications   atorvastatin (LIPITOR) 20 MG tablet   Other Relevant Orders   Lipid panel      Meds ordered this encounter  Medications  . atorvastatin (LIPITOR) 20 MG tablet    Sig: Take 1 tablet (20 mg total) by mouth daily.    Dispense:  90 tablet    Refill:  0  . metFORMIN (GLUCOPHAGE) 1000 MG tablet    Sig: TAKE 1 TABLET BY MOUTH TWICE DAILY WITH  A  MEAL    Dispense:  180 tablet    Refill:  0    Needs office visit before any further refills made.  Marland Kitchen.  lisinopril (PRINIVIL,ZESTRIL) 5 MG tablet    Sig: Take 1 tablet (5 mg total) by mouth daily.    Dispense:  90 tablet    Refill:  0   [place] was seen today for diabetes.  Diagnoses and all orders for this visit:  Type 2 diabetes mellitus with hyperosmolarity without coma, without long-term current use of insulin (HCC) -     Ambulatory referral to Ophthalmology  Elevated lipoprotein(a) -     atorvastatin (LIPITOR) 20 MG tablet; Take 1 tablet (20 mg total) by mouth daily. -     Lipid panel; Future  Type 2 diabetes mellitus without complication, without long-term current use of insulin (HCC) Patient reports CBG ranges from 100-120 she reports takes CBG variable times -     metFORMIN (GLUCOPHAGE) 1000 MG tablet; TAKE 1 TABLET BY MOUTH TWICE DAILY WITH  A  MEAL -     Hemoglobin A1c; Future -     Comprehensive metabolic panel; Future  Other orders Renal protection/HTN -     lisinopril (PRINIVIL,ZESTRIL) 5 MG tablet; Take 1 tablet (5 mg total) by mouth daily.   Follow-up: Return in about 3 months (around 12/09/2018) for Diabetes.    Grayce Sessions, NP

## 2019-05-02 ENCOUNTER — Other Ambulatory Visit (INDEPENDENT_AMBULATORY_CARE_PROVIDER_SITE_OTHER): Payer: Self-pay

## 2019-05-02 ENCOUNTER — Other Ambulatory Visit: Payer: Self-pay

## 2019-05-02 DIAGNOSIS — E7841 Elevated Lipoprotein(a): Secondary | ICD-10-CM

## 2019-05-02 DIAGNOSIS — E119 Type 2 diabetes mellitus without complications: Secondary | ICD-10-CM

## 2019-05-03 ENCOUNTER — Other Ambulatory Visit (INDEPENDENT_AMBULATORY_CARE_PROVIDER_SITE_OTHER): Payer: Self-pay | Admitting: Primary Care

## 2019-05-03 DIAGNOSIS — E7841 Elevated Lipoprotein(a): Secondary | ICD-10-CM

## 2019-05-03 LAB — COMPREHENSIVE METABOLIC PANEL
ALT: 10 IU/L (ref 0–32)
AST: 7 IU/L (ref 0–40)
Albumin/Globulin Ratio: 1.2 (ref 1.2–2.2)
Albumin: 4.2 g/dL (ref 3.8–4.9)
Alkaline Phosphatase: 82 IU/L (ref 39–117)
BUN/Creatinine Ratio: 13 (ref 9–23)
BUN: 8 mg/dL (ref 6–24)
Bilirubin Total: 0.3 mg/dL (ref 0.0–1.2)
CO2: 24 mmol/L (ref 20–29)
Calcium: 9.3 mg/dL (ref 8.7–10.2)
Chloride: 102 mmol/L (ref 96–106)
Creatinine, Ser: 0.64 mg/dL (ref 0.57–1.00)
GFR calc Af Amer: 119 mL/min/{1.73_m2} (ref >59)
GFR calc non Af Amer: 104 mL/min/{1.73_m2} (ref >59)
Globulin, Total: 3.4 g/dL (ref 1.5–4.5)
Glucose: 152 mg/dL — ABNORMAL HIGH (ref 65–99)
Potassium: 4.8 mmol/L (ref 3.5–5.2)
Sodium: 138 mmol/L (ref 134–144)
Total Protein: 7.6 g/dL (ref 6.0–8.5)

## 2019-05-03 LAB — LIPID PANEL
Chol/HDL Ratio: 3.5 ratio (ref 0.0–4.4)
Cholesterol, Total: 200 mg/dL — ABNORMAL HIGH (ref 100–199)
HDL: 57 mg/dL (ref >39)
LDL Chol Calc (NIH): 119 mg/dL — ABNORMAL HIGH (ref 0–99)
Triglycerides: 135 mg/dL (ref 0–149)
VLDL Cholesterol Cal: 24 mg/dL (ref 5–40)

## 2019-05-03 LAB — HEMOGLOBIN A1C
Est. average glucose Bld gHb Est-mCnc: 166 mg/dL
Hgb A1c MFr Bld: 7.4 % — ABNORMAL HIGH (ref 4.8–5.6)

## 2019-05-03 MED ORDER — ATORVASTATIN CALCIUM 40 MG PO TABS: 20.0000 mg | ORAL_TABLET | Freq: Every day | ORAL | 1 refills | Status: DC

## 2019-05-03 MED FILL — ATORVASTATIN CALCIUM 40 MG: 40 | 30 days supply | Qty: 15 | Fill #0

## 2019-05-04 ENCOUNTER — Telehealth (INDEPENDENT_AMBULATORY_CARE_PROVIDER_SITE_OTHER): Payer: Self-pay

## 2019-05-04 DIAGNOSIS — I10 Essential (primary) hypertension: Secondary | ICD-10-CM

## 2019-05-04 DIAGNOSIS — E119 Type 2 diabetes mellitus without complications: Secondary | ICD-10-CM

## 2019-05-04 DIAGNOSIS — E7841 Elevated Lipoprotein(a): Secondary | ICD-10-CM

## 2019-05-04 MED ORDER — LISINOPRIL 5 MG PO TABS: 5.0000 mg | ORAL_TABLET | Freq: Every day | ORAL | 1 refills | Status: DC

## 2019-05-04 MED ORDER — ATORVASTATIN CALCIUM 40 MG PO TABS: 20.0000 mg | ORAL_TABLET | Freq: Every day | ORAL | 1 refills | Status: DC

## 2019-05-04 MED ORDER — METFORMIN HCL 1000 MG PO TABS: ORAL_TABLET | ORAL | 1 refills | Status: DC

## 2019-05-04 NOTE — Telephone Encounter (Signed)
-----   Message from Michelle P Edwards, NP sent at 05/03/2019  4:18 PM EST ----- Cholesterol elevated sent in atorvastatin 40 mg take at bed time decrease fatty foods, red meat, cheese, milk and increase fiber like whole grains and veggies. Other labs wnl 

## 2019-05-04 NOTE — Telephone Encounter (Signed)
Call placed using pacific interpreter [phone]) patient is aware that Cholesterol elevated sent in atorvastatin 40 mg take at bed time decrease fatty foods, red meat, cheese, milk and increase fiber like whole grains and veggies. Other labs wnl. Nat Christen, CMA

## 2019-05-04 NOTE — Telephone Encounter (Signed)
Called patient using pacific interpreter [phone]) patient is aware that cholesterol is elevated. She is aware that atorvastatin was sent to the pharmacy for her to take at bedtime. She was advised to decrease fatty food, red meats, cheese and milk and increase fiber with whole grains and veggies. She is aware that all other labs are normal. She expressed understanding. She requested that medication be sent to [facility] on [address]. Medications sent. Nat Christen, CMA

## 2019-05-04 NOTE — Telephone Encounter (Signed)
-----   Message from Kerin Perna, NP sent at 05/03/2019  4:18 PM EST ----- Cholesterol elevated sent in atorvastatin 40 mg take at bed time decrease fatty foods, red meat, cheese, milk and increase fiber like whole grains and veggies. Other labs wnl

## 2019-05-11 ENCOUNTER — Other Ambulatory Visit: Payer: Self-pay

## 2019-05-11 ENCOUNTER — Ambulatory Visit (INDEPENDENT_AMBULATORY_CARE_PROVIDER_SITE_OTHER): Payer: Self-pay | Admitting: Primary Care

## 2019-05-11 ENCOUNTER — Encounter (INDEPENDENT_AMBULATORY_CARE_PROVIDER_SITE_OTHER): Payer: Self-pay | Admitting: Primary Care

## 2019-05-11 DIAGNOSIS — I10 Essential (primary) hypertension: Secondary | ICD-10-CM

## 2019-05-11 DIAGNOSIS — E119 Type 2 diabetes mellitus without complications: Secondary | ICD-10-CM

## 2019-05-11 DIAGNOSIS — E7841 Elevated Lipoprotein(a): Secondary | ICD-10-CM

## 2019-05-11 MED ORDER — LISINOPRIL 5 MG PO TABS: 5.0000 mg | ORAL_TABLET | Freq: Every day | ORAL | 1 refills | Status: DC

## 2019-05-11 MED ORDER — ATORVASTATIN CALCIUM 40 MG PO TABS: 20.0000 mg | ORAL_TABLET | Freq: Every day | ORAL | 1 refills | Status: DC

## 2019-05-11 MED ORDER — METFORMIN HCL 1000 MG PO TABS: ORAL_TABLET | ORAL | 1 refills | Status: DC

## 2019-05-11 NOTE — Progress Notes (Signed)
Virtual Visit via Telephone Note  I connected with Christine Pineda on 05/11/19 at 11:10 AM EST by telephone and verified that I am speaking with the correct person using two identifiers.   I discussed the limitations, risks, security and privacy concerns of performing an evaluation and management service by telephone and the availability of in person appointments. I also discussed with the patient that there may be a patient responsible charge related to this service. The patient expressed understanding and agreed to proceed.   History of Present Illness: Christine Pineda is having a tele visit for medication refill. Patient is not out of medication she is requesting refills. CBG range is 90-102 fasting and after meals 120-130.  Past Medical History:  Diagnosis Date  . Diabetes mellitus without complication ([place]) 9242   Current Outpatient Medications on File Prior to Visit  Medication Sig Dispense Refill  . atorvastatin (LIPITOR) 40 MG tablet Take 0.5 tablets (20 mg total) by mouth daily. 90 tablet 1  . lisinopril (ZESTRIL) 5 MG tablet Take 1 tablet (5 mg total) by mouth daily. 90 tablet 1  . metFORMIN (GLUCOPHAGE) 1000 MG tablet TAKE 1 TABLET BY MOUTH TWICE DAILY WITH  A  MEAL 180 tablet 1   No current facility-administered medications on file prior to visit.    Observations/Objective: Review of Systems  All other systems reviewed and are negative.   Assessment and Plan: Christine Pineda was seen today for establish care.  Diagnoses and all orders for this visit:  Type 2 diabetes mellitus without complication, without long-term current use of insulin (HCC) -     metFORMIN (GLUCOPHAGE) 1000 MG tablet; TAKE 1 TABLET BY MOUTH TWICE DAILY WITH  A  MEAL  Elevated lipoprotein(a) -     atorvastatin (LIPITOR) 40 MG tablet; Take 0.5 tablets (20 mg total) by mouth daily.  Essential hypertension -     lisinopril (ZESTRIL) 5 MG tablet; Take 1 tablet (5 mg total) by mouth  daily.    Follow Up Instructions:    I discussed the assessment and treatment plan with the patient. The patient was provided an opportunity to ask questions and all were answered. The patient agreed with the plan and demonstrated an understanding of the instructions.   The patient was advised to call back or seek an in-person evaluation if the symptoms worsen or if the condition fails to improve as anticipated.  I provided 18 minutes of non-face-to-face time during this encounter.   Kerin Perna, NP

## 2019-11-01 ENCOUNTER — Encounter (INDEPENDENT_AMBULATORY_CARE_PROVIDER_SITE_OTHER): Payer: Self-pay | Admitting: Primary Care

## 2019-11-01 ENCOUNTER — Other Ambulatory Visit: Payer: Self-pay

## 2019-11-01 ENCOUNTER — Ambulatory Visit (INDEPENDENT_AMBULATORY_CARE_PROVIDER_SITE_OTHER): Payer: Self-pay | Admitting: Primary Care

## 2019-11-01 VITALS — BP 120/75 | HR 73 | Temp 97.2°F | Ht 65.5 in | Wt 201.6 lb

## 2019-11-01 DIAGNOSIS — I10 Essential (primary) hypertension: Secondary | ICD-10-CM

## 2019-11-01 DIAGNOSIS — E7841 Elevated Lipoprotein(a): Secondary | ICD-10-CM

## 2019-11-01 DIAGNOSIS — G8929 Other chronic pain: Secondary | ICD-10-CM

## 2019-11-01 DIAGNOSIS — M545 Low back pain: Secondary | ICD-10-CM

## 2019-11-01 DIAGNOSIS — E119 Type 2 diabetes mellitus without complications: Secondary | ICD-10-CM

## 2019-11-01 DIAGNOSIS — E1100 Type 2 diabetes mellitus with hyperosmolarity without nonketotic hyperglycemic-hyperosmolar coma (NKHHC): Secondary | ICD-10-CM

## 2019-11-01 DIAGNOSIS — M238X2 Other internal derangements of left knee: Secondary | ICD-10-CM

## 2019-11-01 LAB — POCT GLYCOSYLATED HEMOGLOBIN (HGB A1C): Hemoglobin A1C: 7.3 % — AB (ref 4.0–5.6)

## 2019-11-01 LAB — GLUCOSE, POCT (MANUAL RESULT ENTRY): POC Glucose: 164 mg/dl — AB (ref 70–99)

## 2019-11-01 MED ORDER — METFORMIN HCL 1000 MG PO TABS: ORAL_TABLET | ORAL | 1 refills | Status: DC

## 2019-11-01 MED ORDER — IBUPROFEN 800 MG PO TABS: 800.0000 mg | ORAL_TABLET | Freq: Three times a day (TID) | ORAL | 1 refills | Status: DC | PRN

## 2019-11-01 MED ORDER — LISINOPRIL 5 MG PO TABS: 5.0000 mg | ORAL_TABLET | Freq: Every day | ORAL | 1 refills | Status: DC

## 2019-11-01 MED ORDER — GLIPIZIDE 5 MG PO TABS: 5.0000 mg | ORAL_TABLET | Freq: Two times a day (BID) | ORAL | 1 refills | Status: DC

## 2019-11-01 NOTE — Patient Instructions (Signed)
La diabetes mellitus y el cuidado de los pies Diabetes Mellitus and Foot Care El cuidado de los pies es un aspecto importante de la salud, especialmente si tiene diabetes. La diabetes puede generar problemas debido a que el flujo sanguneo (circulacin) es deficiente en las piernas y los pies, y esto puede hacer que la piel:  Se torne ms fina y seca.  Se resquebraje ms fcilmente.  Cicatrice ms lentamente.  Se descame y agriete. Tambin pueden estar daados los nervios (neuropata) de las piernas y de los pies, lo que provoca una disminucin de la sensibilidad. En consecuencia, es posible que no advierta heridas pequeas en los pies que pueden causar problemas ms graves. Identificar y tratar cualquier complicacin lo antes posible es la mejor manera de evitar futuros problemas de pie. Cmo cuidar los pies Higiene de los pies  Lvese los pies todos los das con agua tibia y un jabn suave. No use agua caliente. Luego squese los pies y entre los dedos dando palmaditas, hasta que estn completamente secos. No remoje los pies, ya que esto puede resecar la piel.  Crtese las uas de los pies en lnea recta. No escarbe debajo de las uas o alrededor de las cutculas. Lime los bordes de las uas con una lima o esmeril.  Aplique una locin hidratante o vaselina en la piel de los pies y en las uas secas y quebradizas. Use una locin que no contenga alcohol ni fragancias. No aplique locin entre los dedos. Zapatos y calcetines  Use calcetines de algodn o medias limpias todos los das. Asegrese de que no le ajusten demasiado. No use calcetines que le lleguen a las rodillas, ya que podran disminuir el flujo de sangre a las piernas.  Use zapatos de cuero que le queden bien y que sean acolchados. Revise siempre los zapatos antes de ponerlos para asegurarse de que no haya objetos en su interior.  Para amoldar los zapatos, clcelos solo algunas horas por da. Esto evitar lesiones en los  pies. Heridas, rasguos, durezas y callosidades  Controle sus pies diariamente para observar si hay ampollas, cortes, moretones, llagas o enrojecimiento. Si no puede ver la planta del pie, use un espejo o pdale ayuda a otra persona.  No corte las durezas o callosidades, ni trate de quitarlas con medicamentos.  Si algo le ha raspado, cortado o lastimado la piel de los pies, mantenga la piel de esa zona limpia y seca. Puede higienizar estas zonas con agua y un jabn suave. No limpie la zona con agua oxigenada, alcohol ni yodo.  Si tiene una herida, un rasguo, una dureza o una callosidad en el pie, revsela varias veces al da para asegurarse de que se est curando y no se infecte. Est atento a los siguientes signos: ? Dolor, hinchazn o enrojecimiento. ? Lquido o sangre. ? Calor. ? Pus o mal olor. Instrucciones generales  No se cruce de piernas. Esto puede disminuir el flujo de sangre a los pies.  No use bolsas de agua caliente ni almohadillas trmicas en los pies. Podran causar quemaduras. Si ha perdido la sensibilidad en los pies o las piernas, no sabr lo que le est sucediendo hasta que sea demasiado tarde.  Proteja sus pies del calor y del fro con calzado, en la playa o sobre el pavimento caliente.  Programe una cita para un examen completo de los pies por lo menos una vez al ao (anualmente) o con ms frecuencia si tiene problemas en los pies. Si tiene problemas en los   pies, infrmele al mdico de inmediato sobre los cortes, las llagas o los moretones. Comunquese con un mdico si:  Tiene una afeccin que aumenta su riesgo de tener infecciones y tiene cortes, llagas o moretones en los pies.  Tiene una lesin que no se cura.  Tiene una zona irritada en las piernas o los pies.  Siente una sensacin de ardor u hormigueo en las piernas o los pies.  Siente dolor o calambres en las piernas o los pies.  Las piernas o los pies estn adormecidos.  Siente los pies siempre  fros.  Siente dolor alrededor de una ua del pie. Solicite ayuda de inmediato si:  Tiene una herida, un rasguo, una dureza o una callosidad en el pie y: ? Tiene dolor, hinchazn o enrojecimiento que empeora. ? Le sale lquido o sangre de la herida, el rasguo, la dureza o la callosidad. ? La herida, el rasguo, la dureza o la callosidad est caliente al tacto. ? Le sale pus o mal olor de la herida, el rasguo, la dureza o la callosidad. ? Tiene fiebre. ? Tiene una lnea roja que sube por la pierna. Resumen  Controle todos los das el estado de sus pies para observar si hay cortes, llagas, manchas rojas, hinchazn o ampollas.  Humctese los pies y las piernas a diario.  Use zapatos de cuero que le queden bien y que sean acolchados.  Si tiene problemas en los pies, infrmele al mdico de inmediato sobre los cortes, las llagas o los moretones.  Programe una cita para un examen completo de los pies por lo menos una vez al ao (anualmente) o con ms frecuencia si tiene problemas en los pies. Esta informacin no tiene como fin reemplazar el consejo del mdico. Asegrese de hacerle al mdico cualquier pregunta que tenga. Document Revised: 01/09/2017 Document Reviewed: 01/09/2017 Elsevier Patient Education  2020 [employer]  

## 2019-11-01 NOTE — Progress Notes (Signed)
Established Patient Office Visit  Subjective:  Patient ID: Christine Pineda, female    DOB: 05/28/1966  Age: 54 y.o. MRN: [id number]  CC:  Chief Complaint  Patient presents with  . Diabetes  . Medication Refill    HPI Christine Pineda is a 54 year old Hispanic patient only speaks Spanish interpretor use [name] [phone]  presents for diabetes management and complains of low back pain and bilateral knee pain. Blood pressure well controlled. She denies shortness of breath, headaches, chest pain or lower extremity edema.  Past Medical History:  Diagnosis Date  . Diabetes mellitus without complication (HCC) 2008    Past Surgical History:  Procedure Laterality Date  . CESAREAN SECTION     for macrosomia  . TUBAL LIGATION Bilateral 06/02/2006   Performed at [facility], however, can't find record    Family History  Problem Relation Age of Onset  . Diabetes Father   . Breast cancer Neg Hx     Social History   Socioeconomic History  . Marital status: Single    Spouse name: Not on file  . Number of children: Not on file  . Years of education: Not on file  . Highest education level: Not on file  Occupational History  . Not on file  Tobacco Use  . Smoking status: Never Smoker  . Smokeless tobacco: Never Used  Substance and Sexual Activity  . Alcohol use: No  . Drug use: No  . Sexual activity: Yes    Birth control/protection: Surgical  Other Topics Concern  . Not on file  Social History Narrative  . Not on file   Social Determinants of Health   Financial Resource Strain:   . Difficulty of Paying Living Expenses:   Food Insecurity:   . Worried About Programme researcher, broadcasting/film/video in the Last Year:   . Barista in the Last Year:   Transportation Needs:   . Freight forwarder (Medical):   Marland Kitchen Lack of Transportation (Non-Medical):   Physical Activity:   . Days of Exercise per Week:   . Minutes of Exercise per Session:   Stress:   . Feeling of Stress  :   Social Connections:   . Frequency of Communication with Friends and Family:   . Frequency of Social Gatherings with Friends and Family:   . Attends Religious Services:   . Active Member of Clubs or Organizations:   . Attends Banker Meetings:   Marland Kitchen Marital Status:   Intimate Partner Violence:   . Fear of Current or Ex-Partner:   . Emotionally Abused:   Marland Kitchen Physically Abused:   . Sexually Abused:     Outpatient Medications Prior to Visit  Medication Sig Dispense Refill  . atorvastatin (LIPITOR) 40 MG tablet Take 0.5 tablets (20 mg total) by mouth daily. 90 tablet 1  . lisinopril (ZESTRIL) 5 MG tablet Take 1 tablet (5 mg total) by mouth daily. 90 tablet 1  . metFORMIN (GLUCOPHAGE) 1000 MG tablet TAKE 1 TABLET BY MOUTH TWICE DAILY WITH  A  MEAL 180 tablet 1   No facility-administered medications prior to visit.    No Known Allergies  ROS Review of Systems  Musculoskeletal: Positive for arthralgias, back pain and joint swelling.       Bilateral knee pain      Objective:    Physical Exam  Constitutional: She is oriented to person, place, and time. She appears well-developed and well-nourished.  HENT:  Head: Normocephalic.  Eyes:  Pupils are equal, round, and reactive to light. EOM are normal.  Cardiovascular: Normal rate and regular rhythm.  Pulmonary/Chest: Effort normal and breath sounds normal.  Abdominal: Soft. Bowel sounds are normal.  Musculoskeletal:        General: Tenderness present.     Cervical back: Normal range of motion.     Comments: Knee pain and back pain/ left has crepitus   Neurological: She is alert and oriented to person, place, and time. She has normal reflexes.  Skin: Skin is warm and dry.  Psychiatric: She has a normal mood and affect. Her behavior is normal. Judgment and thought content normal.    BP 120/75 (BP Location: Right Arm, Patient Position: Sitting, Cuff Size: Large)   Pulse 73   Temp (!) 97.2 F (36.2 C) (Tympanic)   Ht  5' 5.5" (1.664 m)   Wt 201 lb 9.6 oz (91.4 kg)   SpO2 99%   BMI 33.04 kg/m  Wt Readings from Last 3 Encounters:  11/01/19 201 lb 9.6 oz (91.4 kg)  05/06/18 204 lb (92.5 kg)  03/11/18 199 lb (90.3 kg)     Health Maintenance Due  Topic Date Due  . COVID-19 Vaccine (1) Never done  . OPHTHALMOLOGY EXAM  02/16/2014  . Fecal DNA (Cologuard)  Never done    There are no preventive care reminders to display for this patient.  Lab Results  Component Value Date   TSH 1.750 03/11/2018   Lab Results  Component Value Date   WBC 7.8 03/11/2018   HGB 11.3 03/11/2018   HCT 34.2 03/11/2018   MCV 88 03/11/2018   PLT 297 03/11/2018   Lab Results  Component Value Date   NA 138 05/02/2019   K 4.8 05/02/2019   CO2 24 05/02/2019   GLUCOSE 152 (H) 05/02/2019   BUN 8 05/02/2019   CREATININE 0.64 05/02/2019   BILITOT 0.3 05/02/2019   ALKPHOS 82 05/02/2019   AST 7 05/02/2019   ALT 10 05/02/2019   PROT 7.6 05/02/2019   ALBUMIN 4.2 05/02/2019   CALCIUM 9.3 05/02/2019   Lab Results  Component Value Date   CHOL 200 (H) 05/02/2019   Lab Results  Component Value Date   HDL 57 05/02/2019   Lab Results  Component Value Date   LDLCALC 119 (H) 05/02/2019   Lab Results  Component Value Date   TRIG 135 05/02/2019   Lab Results  Component Value Date   CHOLHDL 3.5 05/02/2019   Lab Results  Component Value Date   HGBA1C 7.3 (A) 11/01/2019      Assessment & Plan:  Wendie was seen today for diabetes and medication refill.  Diagnoses and all orders for this visit:  Type 2 diabetes mellitus with hyperosmolarity without coma, without long-term current use of insulin ([place]) ADA recommends the following therapeutic goals for glycemic control related to A1c measurements: Goal of therapy: Less than 6.5 hemoglobin A1c.  Reference clinical practice recommendations. Foods that are high in carbohydrates are the following rice, potatoes, breads, sugars, and pastas.  Reduction in the intake  (eating) will assist in lowering your blood sugars. Added glucotrol 5mg  twice daily and continue metformin 100mg  twice daily -     HgB A1c -     Glucose (CBG)  Chronic low back pain, unspecified back pain laterality, unspecified whether sciatica present BACK PAIN  Location: lumbar/sacral Quality: aching and uncomfortableOnset: gradual approximately 4-6 weeks Worse with: bending lifting      Better with: leaning back  Radiation:  yes down right leg to foot Trauma: no Best sitting/standing/leaning forward:yes  Red Flags Fecal/urinary incontinence: no  Numbness/Weakness: no  Fever/chills/sweats: no  Night pain: no  Unexplained weight loss: no  No relief with bedrest: yes  h/o cancer/immunosuppression: no  IV drug use: no  PMH of osteoporosis or chronic steroid use: no,   Knee crepitus, left Refer orthopedic   Essential hypertension Continue on Lisinopril  Bp well control for renal protection , low-sodium, DASH diet, medication compliance, 150 minutes of moderate intensity exercise per week. Discussed medication compliance, adverse effects. -     lisinopril (ZESTRIL) 5 MG tablet; Take 1 tablet (5 mg total) by mouth daily.  Type 2 diabetes mellitus without complication, without long-term current use of insulin (HCC) -     metFORMIN (GLUCOPHAGE) 1000 MG tablet; TAKE 1 TABLET BY MOUTH TWICE DAILY WITH  A  MEAL  Elevated lipoprotein(a) Currently on atorvastatin 20mg  . Labs order to determine continue dosage or reduction. Decrease your fatty foods, red meat, cheese, milk and increase fiber like whole grains and veggies.  -     Lipid Panel  Comprehensive diabetic foot examination, type 2 DM, encounter for [facility]) Completed   Other orders -     glipiZIDE (GLUCOTROL) 5 MG tablet; Take 1 tablet (5 mg total) by mouth 2 (two) times daily before a meal.    Meds ordered this encounter  Medications  . metFORMIN (GLUCOPHAGE) 1000 MG tablet    Sig: TAKE 1 TABLET BY MOUTH TWICE DAILY WITH  A   MEAL    Dispense:  180 tablet    Refill:  1    Needs office visit before any further refills made.  [facility] glipiZIDE (GLUCOTROL) 5 MG tablet    Sig: Take 1 tablet (5 mg total) by mouth 2 (two) times daily before a meal.    Dispense:  180 tablet    Refill:  1  . lisinopril (ZESTRIL) 5 MG tablet    Sig: Take 1 tablet (5 mg total) by mouth daily.    Dispense:  90 tablet    Refill:  1  . ibuprofen (ADVIL) 800 MG tablet    Sig: Take 1 tablet (800 mg total) by mouth every 8 (eight) hours as needed.    Dispense:  90 tablet    Refill:  1    Follow-up: Return in about 3 months (around 02/01/2020) for Dm.    04/02/2020, NP

## 2019-11-01 NOTE — Progress Notes (Signed)
Pt also complaining of knee pain and pain around her waist.

## 2019-11-02 LAB — LIPID PANEL
Chol/HDL Ratio: 2.5 ratio (ref 0.0–4.4)
Cholesterol, Total: 133 mg/dL (ref 100–199)
HDL: 53 mg/dL (ref >39)
LDL Chol Calc (NIH): 66 mg/dL (ref 0–99)
Triglycerides: 71 mg/dL (ref 0–149)
VLDL Cholesterol Cal: 14 mg/dL (ref 5–40)

## 2019-11-03 ENCOUNTER — Other Ambulatory Visit (INDEPENDENT_AMBULATORY_CARE_PROVIDER_SITE_OTHER): Payer: Self-pay | Admitting: Primary Care

## 2019-11-03 MED ORDER — ATORVASTATIN CALCIUM 10 MG PO TABS
10.0000 mg | ORAL_TABLET | Freq: Every day | ORAL | 1 refills | Status: DC
Start: 2019-11-03 — End: 2020-02-01

## 2019-11-04 ENCOUNTER — Telehealth (INDEPENDENT_AMBULATORY_CARE_PROVIDER_SITE_OTHER): Payer: Self-pay

## 2019-11-04 NOTE — Telephone Encounter (Signed)
Call placed to patient with the assistance of pacific interpreter [phone]) patient verified date of birth. She is aware that her Cholesterol is normal but due to type 2 diabetes recommend a statin sent in atorvastatin 10mg  nightly. She verbalized understanding. , CMA

## 2019-11-04 NOTE — Telephone Encounter (Signed)
-----   Message from Grayce Sessions, NP sent at 11/03/2019  5:04 PM EDT ----- Cholesterol is normal but due to type 2 diabetes recommend a statin sent in atorvastatin 10mg  nightly

## 2019-11-16 ENCOUNTER — Other Ambulatory Visit: Payer: Self-pay

## 2019-11-16 ENCOUNTER — Ambulatory Visit: Payer: Self-pay

## 2020-02-01 ENCOUNTER — Other Ambulatory Visit: Payer: Self-pay

## 2020-02-01 ENCOUNTER — Encounter (INDEPENDENT_AMBULATORY_CARE_PROVIDER_SITE_OTHER): Payer: Self-pay | Admitting: Primary Care

## 2020-02-01 ENCOUNTER — Ambulatory Visit (INDEPENDENT_AMBULATORY_CARE_PROVIDER_SITE_OTHER): Payer: Self-pay | Admitting: Primary Care

## 2020-02-01 VITALS — BP 114/76 | HR 63 | Temp 97.7°F | Ht 65.5 in | Wt 198.2 lb

## 2020-02-01 DIAGNOSIS — M545 Low back pain, unspecified: Secondary | ICD-10-CM

## 2020-02-01 DIAGNOSIS — Z1159 Encounter for screening for other viral diseases: Secondary | ICD-10-CM

## 2020-02-01 DIAGNOSIS — E1100 Type 2 diabetes mellitus with hyperosmolarity without nonketotic hyperglycemic-hyperosmolar coma (NKHHC): Secondary | ICD-10-CM

## 2020-02-01 DIAGNOSIS — Z23 Encounter for immunization: Secondary | ICD-10-CM

## 2020-02-01 DIAGNOSIS — E119 Type 2 diabetes mellitus without complications: Secondary | ICD-10-CM

## 2020-02-01 DIAGNOSIS — Z760 Encounter for issue of repeat prescription: Secondary | ICD-10-CM

## 2020-02-01 DIAGNOSIS — G8929 Other chronic pain: Secondary | ICD-10-CM

## 2020-02-01 DIAGNOSIS — I10 Essential (primary) hypertension: Secondary | ICD-10-CM

## 2020-02-01 LAB — POCT GLYCOSYLATED HEMOGLOBIN (HGB A1C): Hemoglobin A1C: 6.7 % — AB (ref 4.0–5.6)

## 2020-02-01 LAB — POCT CBG (FASTING - GLUCOSE)-MANUAL ENTRY: Glucose Fasting, POC: 138 mg/dL — AB (ref 70–99)

## 2020-02-01 MED ORDER — ATORVASTATIN CALCIUM 10 MG PO TABS: 10.0000 mg | ORAL_TABLET | Freq: Every day | ORAL | 1 refills | Status: DC

## 2020-02-01 MED ORDER — METFORMIN HCL 1000 MG PO TABS: ORAL_TABLET | ORAL | 1 refills | Status: DC

## 2020-02-01 MED ORDER — GLIPIZIDE 5 MG PO TABS: 5.0000 mg | ORAL_TABLET | Freq: Two times a day (BID) | ORAL | 1 refills | Status: DC

## 2020-02-01 MED ORDER — LISINOPRIL 5 MG PO TABS: 5.0000 mg | ORAL_TABLET | Freq: Every day | ORAL | 1 refills | Status: DC

## 2020-02-01 MED ORDER — IBUPROFEN 800 MG PO TABS: 800.0000 mg | ORAL_TABLET | Freq: Three times a day (TID) | ORAL | 1 refills | Status: DC | PRN

## 2020-02-01 NOTE — Patient Instructions (Signed)
Gripe en los adultos Influenza, Adult La gripe, tambin llamada "influenza", es una infeccin viral que afecta, principalmente, las vas respiratorias. Las vas respiratorias incluyen rganos que ayudan a respirar, como los pulmones, la nariz y la garganta. La gripe provoca muchos sntomas similares a los del resfro comn, junto con fiebre alta y dolor corporal. Se transmite fcilmente de persona a persona (es contagiosa). La mejor manera de prevenir la gripe es aplicndose la vacuna contra la gripe todos los aos. Cules son las causas? La causa de esta afeccin es el virus de la influenza. Puede contraer el virus de las siguientes maneras:  Al inhalar las gotitas que estn en el aire liberadas por la tos o el estornudo de una persona infectada.  Al tocar algo que estuvo expuesto al virus (fue contaminado) y luego tocarse la boca, nariz u ojos. Qu incrementa el riesgo? Los siguientes factores pueden hacer que usted sea propenso a contraer la gripe:  No lavarse o desinfectarse las manos con frecuencia.  Tener contacto cercano con muchas personas durante la temporada de resfro y gripe.  Tocarse la boca, los ojos o la nariz sin antes lavarse ni desinfectarse las manos.  No colocarse la vacuna anual contra la gripe. Puede correr un mayor riesgo de tener gripe, incluso problemas graves como una infeccin pulmonar (neumona), si:  Es mayor de 65 aos de edad.  Est embarazada.  Tiene debilitado el sistema que combate las enfermedades (sistema inmunitario). Puede tener un sistema inmunitario debilitado si: ? Tienen VIH o sndrome de inmunodeficiencia adquirida (SIDA). ? Est recibiendo quimioterapia. ? Usa medicamentos que reducen (suprimen) la actividad de su sistema inmunitario.  Tiene una enfermedad a largo plazo (crnica), como una enfermedad cardaca, enfermedad renal, diabetes o enfermedad pulmonar.  Tiene un trastorno heptico.  Tiene mucho sobrepeso (obesidad  mrbida).  Tiene anemia. Esta es una afeccin que afecta a los glbulos rojos.  Tiene asma. Cules son los signos o los sntomas? Los sntomas de esta afeccin por lo general comienzan de repente y duran entre 4 y 14das. Pueden incluir los siguientes:  Fiebre y escalofros.  Dolores de cabeza, dolores en el cuerpo o dolores musculares.  Dolor de garganta.  Tos.  Secrecin o congestin nasal.  Malestar en el pecho.  Falta de apetito.  Debilidad o fatiga.  Mareos.  Nuseas o vmitos. Cmo se diagnostica? Esta afeccin se puede diagnosticar en funcin de lo siguiente:  Los sntomas y los antecedentes mdicos.  Un examen fsico.  Un hisopado de nariz o garganta y el anlisis del lquido extrado para detectar el virus de la gripe. Cmo se trata? Si la gripe se diagnostica de forma temprana, puede tratarse con medicamentos que pueden ayudar a reducir la gravedad de la enfermedad y reducir su duracin (medicamentos antivirales). Estos pueden administrarse por boca (va oral) o por va intravenosa. Cuidarse en su hogar tambin puede ayudar a aliviar los sntomas. El mdico puede recomendarle lo siguiente:  Tomar medicamentos de venta libre.  Beber mucho lquido. En muchos casos, la gripe desaparece sola. Si tiene sntomas graves o complicaciones, puede tratarse en un hospital. Siga estas indicaciones en su casa: Actividad  Descanse segn sea necesario y duerma bien.  Qudese en su casa y no concurra al trabajo o a la escuela como se lo haya indicado su mdico. A menos que visite al mdico, evite salir de su casa hasta que la fiebre haya desaparecido por 24 horas sin tomar medicamentos. Comida y bebida  Tome una solucin de rehidratacin oral (  oral rehydration solution, ORS). Esta es una bebida que se vende en farmacias y tiendas minoristas.  Beba suficiente lquido como para mantener la orina de color amarillo plido.  En la medida en que pueda, beba lquidos  claros en pequeas cantidades. Beba lquidos claros, como agua, cubitos de hielo, jugos de fruta diluidos y bebidas deportivas bajas en caloras.  En la medida en que pueda, consuma alimentos blandos y fciles de digerir en pequeas cantidades. Estos alimentos incluyen bananas, compota de manzana, arroz, carnes magras, tostadas y galletas saladas.  Evite consumir lquidos que contengan mucha azcar o cafena, como bebidas energticas, bebidas deportivas comunes y refrescos.  Evite tomar alcohol.  Evite los alimentos condimentados o con alto contenido de grasa. Indicaciones generales      Tome los medicamentos de venta libre y los recetados solamente como se lo haya indicado el mdico.  Use un humidificador de aire fro para agregar humedad al aire de su casa. Esto puede facilitar la respiracin.  Al toser o estornudar, cbrase la boca y la nariz.  Lvese las manos con agua y jabn frecuentemente, en especial despus de toser o estornudar. Use desinfectante para manos con alcohol si no dispone de agua y jabn.  Concurra a todas las visitas de control como se lo haya indicado el mdico. Esto es importante. Cmo se evita?   Colquese la vacuna anual contra la gripe. Puede colocarse la vacuna contra la gripe a fines de verano, en otoo o en invierno. Pregntele al mdico cundo debe colocarse la vacuna contra la gripe.  Evite el contacto con personas que estn enfermas durante la temporada de resfro y gripe. Generalmente es durante el otoo y el invierno. Comunquese con un mdico si:  Tiene nuevos sntomas.  Tiene los siguientes sntomas: ? Dolor en el pecho. ? Diarrea. ? Fiebre.  La tos empeora.  Produce ms mucosidad.  Siente nuseas o vomita. Solicite ayuda inmediatamente si:  Le falta el aire o tiene dificultad para respirar.  La piel o las uas se tornan de un color azulado.  Presenta dolor intenso o rigidez de cuello.  Le duele la cabeza, la cara o el odo de  forma repentina.  No puede comer ni beber sin vomitar. Resumen  La gripe es una infeccin viral que afecta principalmente las vas respiratorias.  Los sntomas de la gripe normalmente comienzan de repente y duran entre 4 y 14 das.  Colocarse la vacuna anual contra la gripe es la mejor manera de prevenir el contagio de la gripe.  Qudese en su casa y no concurra al trabajo o a la escuela como se lo haya indicado su mdico. A menos que visite al mdico, evite salir de su casa hasta que la fiebre haya desaparecido por 24 horas sin tomar medicamentos.  Concurra a todas las visitas de control como se lo haya indicado el mdico. Esto es importante. Esta informacin no tiene como fin reemplazar el consejo del mdico. Asegrese de hacerle al mdico cualquier pregunta que tenga. Document Revised: 12/30/2017 Document Reviewed: 12/30/2017 Elsevier Patient Education  2020 Elsevier Inc.  

## 2020-02-01 NOTE — Progress Notes (Signed)
Subjective:  Patient ID: Christine Pineda, female    DOB: 1965/10/26  Age: 54 y.o. MRN: [id number]  CC: Diabetes   HPI [name] is a 54 year old Hispanic female that presents for the management of type 2 diabetes. Christine Pineda [phone] interpretor   Outpatient Medications Prior to Visit  Medication Sig Dispense Refill  . atorvastatin (LIPITOR) 10 MG tablet Take 1 tablet (10 mg total) by mouth daily. 90 tablet 1  . glipiZIDE (GLUCOTROL) 5 MG tablet Take 1 tablet (5 mg total) by mouth 2 (two) times daily before a meal. 180 tablet 1  . ibuprofen (ADVIL) 800 MG tablet Take 1 tablet (800 mg total) by mouth every 8 (eight) hours as needed. 90 tablet 1  . lisinopril (ZESTRIL) 5 MG tablet Take 1 tablet (5 mg total) by mouth daily. 90 tablet 1  . metFORMIN (GLUCOPHAGE) 1000 MG tablet TAKE 1 TABLET BY MOUTH TWICE DAILY WITH  A  MEAL 180 tablet 1   No facility-administered medications prior to visit.    ROS Review of Systems  Musculoskeletal:       Little  back pain  All other systems reviewed and are negative.   Objective:  BP 114/76 (BP Location: Right Arm, Patient Position: Sitting, Cuff Size: Large)   Pulse 63   Temp 97.7 F (36.5 C) (Oral)   Ht 5' 5.5" (1.664 m)   Wt 198 lb 3.2 oz (89.9 kg)   LMP 01/31/2020 (Exact Date)   SpO2 98%   BMI 32.48 kg/m   BP/Weight 02/01/2020 11/01/2019 01/06/5783  Systolic BP 696 295 284  Diastolic BP 76 75 76  Wt. (Lbs) 198.2 201.6 204  BMI 32.48 33.04 33.43    Lab Results  Component Value Date   WBC 7.8 03/11/2018   HGB 11.3 03/11/2018   HCT 34.2 03/11/2018   PLT 297 03/11/2018   GLUCOSE 152 (H) 05/02/2019   CHOL 133 11/01/2019   TRIG 71 11/01/2019   HDL 53 11/01/2019   LDLCALC 66 11/01/2019   ALT 10 05/02/2019   AST 7 05/02/2019   NA 138 05/02/2019   K 4.8 05/02/2019   CL 102 05/02/2019   CREATININE 0.64 05/02/2019   BUN 8 05/02/2019   CO2 24 05/02/2019   TSH 1.750 03/11/2018   HGBA1C 6.7 (A) 02/01/2020    MICROALBUR <0.2 08/27/2015    Physical Exam Constitutional:      Appearance: She is obese.  HENT:     Right Ear: Tympanic membrane normal.     Left Ear: Tympanic membrane normal.     Nose: Nose normal.  Eyes:     Extraocular Movements: Extraocular movements intact.     Pupils: Pupils are equal, round, and reactive to light.  Cardiovascular:     Rate and Rhythm: Regular rhythm.     Pulses: Normal pulses.     Heart sounds: Normal heart sounds.  Pulmonary:     Effort: Pulmonary effort is normal.     Breath sounds: Normal breath sounds.  Abdominal:     General: Bowel sounds are normal.     Palpations: Abdomen is soft.  Musculoskeletal:        General: Normal range of motion.     Cervical back: Normal range of motion.  Neurological:     Mental Status: She is alert and oriented to person, place, and time.  Psychiatric:        Mood and Affect: Mood normal.        Behavior: Behavior normal.  Thought Content: Thought content normal.        Judgment: Judgment normal.      Assessment & Plan:   Diagnoses and all orders for this visit:  Type 2 diabetes mellitus with hyperosmolarity without coma, without long-term current use of insulin (HCC)  Goal of therapy: Less than 6.5 hemoglobin A1c. Improving previously 7.3  Today 6.7  Continuing monitoring foods that are high in carbohydrates are the following rice, potatoes, breads, sugars, and pastas. Will decrease metformin on follow up if A1C remain 6.7 or below. -     HgB A1c 6.7  -     Glucose (CBG), Fasting -     CBC with Differential  Essential hypertension Continue ACE inhibitor for renal protection  -     CMP14+EGFR; Future -     lisinopril (ZESTRIL) 5 MG tablet; Take 1 tablet (5 mg total) by mouth daily.  Chronic low back pain, unspecified back pain laterality, unspecified whether sciatica present -     ibuprofen (ADVIL) 800 MG tablet; Take 1 tablet (800 mg total) by mouth every 8 (eight) hours as needed.  Need for  hepatitis C screening test -     Hepatitis C Antibody Health Maintenance and care gap  Other orders/Medication refill -     atorvastatin (LIPITOR) 10 MG tablet; Take 1 tablet (10 mg total) by mouth daily. -     glipiZIDE (GLUCOTROL) 5 MG tablet; Take 1 tablet (5 mg total) by mouth 2 (two) times daily before a meal.    Meds ordered this encounter  Medications  . atorvastatin (LIPITOR) 10 MG tablet    Sig: Take 1 tablet (10 mg total) by mouth daily.    Dispense:  90 tablet    Refill:  1  . metFORMIN (GLUCOPHAGE) 1000 MG tablet    Sig: TAKE 1 TABLET BY MOUTH TWICE DAILY WITH  A  MEAL    Dispense:  180 tablet    Refill:  1    Needs office visit before any further refills made.  Marland Kitchen glipiZIDE (GLUCOTROL) 5 MG tablet    Sig: Take 1 tablet (5 mg total) by mouth 2 (two) times daily before a meal.    Dispense:  180 tablet    Refill:  1  . lisinopril (ZESTRIL) 5 MG tablet    Sig: Take 1 tablet (5 mg total) by mouth daily.    Dispense:  90 tablet    Refill:  1  . ibuprofen (ADVIL) 800 MG tablet    Sig: Take 1 tablet (800 mg total) by mouth every 8 (eight) hours as needed.    Dispense:  90 tablet    Refill:  1    Follow-up: Return in about 3 months (around 05/02/2020) for Follow up on T2D, HTN.   Kerin Perna NP

## 2020-02-02 LAB — CBC WITH DIFFERENTIAL/PLATELET
Basophils Absolute: 0 10*3/uL (ref 0.0–0.2)
Basos: 1 %
EOS (ABSOLUTE): 0 10*3/uL (ref 0.0–0.4)
Eos: 0 %
Hematocrit: 33.2 % — ABNORMAL LOW (ref 34.0–46.6)
Hemoglobin: 10.9 g/dL — ABNORMAL LOW (ref 11.1–15.9)
Immature Grans (Abs): 0 10*3/uL (ref 0.0–0.1)
Immature Granulocytes: 0 %
Lymphocytes Absolute: 1.5 10*3/uL (ref 0.7–3.1)
Lymphs: 23 %
MCH: 28.8 pg (ref 26.6–33.0)
MCHC: 32.8 g/dL (ref 31.5–35.7)
MCV: 88 fL (ref 79–97)
Monocytes Absolute: 0.4 10*3/uL (ref 0.1–0.9)
Monocytes: 6 %
Neutrophils Absolute: 4.4 10*3/uL (ref 1.4–7.0)
Neutrophils: 70 %
Platelets: 290 10*3/uL (ref 150–450)
RBC: 3.78 x10E6/uL (ref 3.77–5.28)
RDW: 14.4 % (ref 11.7–15.4)
WBC: 6.2 10*3/uL (ref 3.4–10.8)

## 2020-02-02 LAB — CMP14+EGFR
ALT: 13 IU/L (ref 0–32)
AST: 13 IU/L (ref 0–40)
Albumin/Globulin Ratio: 1.6 (ref 1.2–2.2)
Albumin: 4.5 g/dL (ref 3.8–4.9)
Alkaline Phosphatase: 69 IU/L (ref 48–121)
BUN/Creatinine Ratio: 11 (ref 9–23)
BUN: 7 mg/dL (ref 6–24)
Bilirubin Total: 0.4 mg/dL (ref 0.0–1.2)
CO2: 23 mmol/L (ref 20–29)
Calcium: 9.3 mg/dL (ref 8.7–10.2)
Chloride: 103 mmol/L (ref 96–106)
Creatinine, Ser: 0.62 mg/dL (ref 0.57–1.00)
GFR calc Af Amer: 120 mL/min/{1.73_m2} (ref >59)
GFR calc non Af Amer: 104 mL/min/{1.73_m2} (ref >59)
Globulin, Total: 2.8 g/dL (ref 1.5–4.5)
Glucose: 137 mg/dL — ABNORMAL HIGH (ref 65–99)
Potassium: 4.6 mmol/L (ref 3.5–5.2)
Sodium: 140 mmol/L (ref 134–144)
Total Protein: 7.3 g/dL (ref 6.0–8.5)

## 2020-02-02 LAB — HEPATITIS C ANTIBODY: Hep C Virus Ab: <0.1 s/co ratio (ref 0.0–0.9)

## 2020-05-02 ENCOUNTER — Ambulatory Visit (INDEPENDENT_AMBULATORY_CARE_PROVIDER_SITE_OTHER): Payer: Self-pay | Admitting: Primary Care

## 2020-05-17 ENCOUNTER — Encounter (INDEPENDENT_AMBULATORY_CARE_PROVIDER_SITE_OTHER): Payer: Self-pay | Admitting: Primary Care

## 2020-05-17 ENCOUNTER — Ambulatory Visit (INDEPENDENT_AMBULATORY_CARE_PROVIDER_SITE_OTHER): Payer: Self-pay | Admitting: Primary Care

## 2020-05-17 ENCOUNTER — Other Ambulatory Visit: Payer: Self-pay

## 2020-05-17 VITALS — BP 109/71 | HR 62 | Temp 97.2°F | Ht 65.5 in | Wt 202.8 lb

## 2020-05-17 DIAGNOSIS — E119 Type 2 diabetes mellitus without complications: Secondary | ICD-10-CM

## 2020-05-17 DIAGNOSIS — I10 Essential (primary) hypertension: Secondary | ICD-10-CM

## 2020-05-17 DIAGNOSIS — Z1231 Encounter for screening mammogram for malignant neoplasm of breast: Secondary | ICD-10-CM

## 2020-05-17 LAB — POCT GLYCOSYLATED HEMOGLOBIN (HGB A1C): Hemoglobin A1C: 6.9 % — AB (ref 4.0–5.6)

## 2020-05-17 LAB — GLUCOSE, POCT (MANUAL RESULT ENTRY): POC Glucose: 92 mg/dl (ref 70–99)

## 2020-05-17 NOTE — Progress Notes (Signed)
S:     Chief Complaint  Patient presents with   Follow-up    Hypertension and DM    Ms. Christine Pineda is a 54 year old Hispanic female that speaks Spanish only interpreter used Charlyn Minerva [id number]  She presents for diabetes management.  Patient denies polyuria, polydipsia, polyphagia and visual changes.  Hypertension is unremarkable 109/71, denies shortness of breath, headaches, chest pain or lower extremity edema  Family/Social History: father and siblings  Patient reports adherence with medications.  Current diabetes medications include:  Metformin at 1000 mg and Glucotrol 5 mg total taken daily Current hypertension medications include: lisinopril 5mg  daily Current hyperlipidemia medications include:atorvastatin 10 mg  Patient denies hypoglycemic events.  Patient reported dietary habits: Eats 3 meals/day low carbohydrate diet   Patient-reported exercise habits: no  Patient denies nocturia.  Patient denies neuropathy. Patient denies visual changes. Patient reports self foot exams.     O:  Physical Exam Vitals reviewed.  Constitutional:      Appearance: She is obese.  HENT:     Head: Normocephalic.     Right Ear: There is impacted cerumen.     Left Ear: Tympanic membrane normal.     Ears:     Comments: Debrox placed in ear    Nose: Nose normal.  Eyes:     Extraocular Movements: Extraocular movements intact.     Pupils: Pupils are equal, round, and reactive to light.  Cardiovascular:     Rate and Rhythm: Normal rate and regular rhythm.  Pulmonary:     Effort: Pulmonary effort is normal.     Breath sounds: Normal breath sounds.  Abdominal:     General: Bowel sounds are normal. There is distension.     Palpations: Abdomen is soft.  Musculoskeletal:        General: Normal range of motion.     Cervical back: Normal range of motion and neck supple.  Skin:    General: Skin is warm and dry.  Neurological:     Mental Status: She is oriented to person, place, and  time.  Psychiatric:        Mood and Affect: Mood normal.        Behavior: Behavior normal.        Thought Content: Thought content normal.        Judgment: Judgment normal.      Review of Systems  All other systems reviewed and are negative.    Lab Results  Component Value Date   HGBA1C 6.7 (A) 02/01/2020   Vitals:   05/17/20 1342  BP: 109/71  Pulse: 62  Temp: (!) 97.2 F (36.2 C)  SpO2: 100%    Lipid Panel     Component Value Date/Time   CHOL 133 11/01/2019 1002   TRIG 71 11/01/2019 1002   HDL 53 11/01/2019 1002   CHOLHDL 2.5 11/01/2019 1002   CHOLHDL 3.3 08/03/2014 0950   VLDL 19 08/03/2014 0950   LDLCALC 66 11/01/2019 1002    Home fasting CBG:no    Clinical ASCVD: Yes  The 10-year ASCVD risk score 01/01/2020 DC Jr., et al., 2013) is: 1.8%   Values used to calculate the score:     Age: 89 years     Sex: Female     Is Non-Hispanic African American: No     Diabetic: Yes     Tobacco smoker: No     Systolic Blood Pressure: 109 mmHg     Is BP treated: Yes  HDL Cholesterol: 53 mg/dL     Total Cholesterol: 133 mg/dL    A/P: Diabetes longstanding currently Metformin at 1000 mg twice daily and glipizide 5 mg twice a day patient is able to verbalize appropriate hypoglycemia management plan. Patient is adherent with medication.  -Next A1C anticipated 6 months    Hypertension longstanding currently control on ACE for renal protection   BP goal = 130/80 mmHg. Patient is adherent with medication.   Encounter for screening mammogram for malignant neoplasm of breast Patient completed application for BCCP while in clinic and application has and faxed to [facility]. Patient aware that [facility] will contact her directly to schedule appointment.  Grayce Sessions

## 2020-05-17 NOTE — Patient Instructions (Signed)
Diabetes mellitus y nutricin, en adultos Diabetes Mellitus and Nutrition, Adult Si sufre de diabetes (diabetes mellitus), es muy importante tener hbitos alimenticios saludables debido a que sus niveles de azcar en la sangre (glucosa) se ven afectados en gran medida por lo que come y bebe. Comer alimentos saludables en las cantidades adecuadas, aproximadamente a la misma hora todos los das, lo ayudar a:  Controlar la glucemia.  Disminuir el riesgo de sufrir una enfermedad cardaca.  Mejorar la presin arterial.  Alcanzar o mantener un peso saludable. Todas las personas que sufren de diabetes son diferentes y cada una tiene necesidades diferentes en cuanto a un plan de alimentacin. El mdico puede recomendarle que trabaje con un especialista en dietas y nutricin (nutricionista) para elaborar el mejor plan para usted. Su plan de alimentacin puede variar segn factores como:  Las caloras que necesita.  Los medicamentos que toma.  Su peso.  Sus niveles de glucemia, presin arterial y colesterol.  Su nivel de actividad.  Otras afecciones que tenga, como enfermedades cardacas o renales. Cmo me afectan los carbohidratos? Los carbohidratos, o hidratos de carbono, afectan su nivel de glucemia ms que cualquier otro tipo de alimento. La ingesta de carbohidratos naturalmente aumenta la cantidad de glucosa en la sangre. El recuento de carbohidratos es un mtodo destinado a llevar un registro de la cantidad de carbohidratos que se consumen. El recuento de carbohidratos es importante para mantener la glucemia a un nivel saludable, especialmente si utiliza insulina o toma determinados medicamentos por va oral para la diabetes. Es importante conocer la cantidad de carbohidratos que se pueden ingerir en cada comida sin correr ningn riesgo. Esto es diferente en cada persona. Su nutricionista puede ayudarlo a calcular la cantidad de carbohidratos que debe ingerir en cada comida y en cada  refrigerio. Entre los alimentos que contienen carbohidratos, se incluyen:  Pan, cereal, arroz, pastas y galletas.  Papas y maz.  Guisantes, frijoles y lentejas.  Leche y yogur.  Frutas y jugo.  Postres, como pasteles, galletas, helado y caramelos. Cmo me afecta el alcohol? El alcohol puede provocar disminuciones sbitas de la glucemia (hipoglucemia), especialmente si utiliza insulina o toma determinados medicamentos por va oral para la diabetes. La hipoglucemia es una afeccin potencialmente mortal. Los sntomas de la hipoglucemia (somnolencia, mareos y confusin) son similares a los sntomas de haber consumido demasiado alcohol. Si el mdico afirma que el alcohol es seguro para usted, siga estas pautas:  Limite el consumo de alcohol a no ms de 1medida por da si es mujer y no est embarazada, y a 2medidas si es hombre. Una medida equivale a 12oz (355ml) de cerveza, 5oz (148ml) de vino o 1oz (44ml) de bebidas alcohlicas de alta graduacin.  No beba con el estmago vaco.  Mantngase hidratado bebiendo agua, refrescos dietticos o t helado sin azcar.  Tenga en cuenta que los refrescos comunes, los jugos y otras bebida para mezclar pueden contener mucha azcar y se deben contar como carbohidratos. Cules son algunos consejos para seguir este plan?  Leer las etiquetas de los alimentos  Comience por leer el tamao de la porcin en la "Informacin nutricional" en las etiquetas de los alimentos envasados y las bebidas. La cantidad de caloras, carbohidratos, grasas y otros nutrientes mencionados en la etiqueta se basan en una porcin del alimento. Muchos alimentos contienen ms de una porcin por envase.  Verifique la cantidad total de gramos (g) de carbohidratos totales en una porcin. Puede calcular la cantidad de porciones de carbohidratos al dividir el   total de carbohidratos por 15. Por ejemplo, si un alimento tiene un total de 30g de carbohidratos, equivale a 2  porciones de carbohidratos.  Verifique la cantidad de gramos (g) de grasas saturadas y grasas trans en una porcin. Escoja alimentos que no contengan grasa o que tengan un bajo contenido.  Verifique la cantidad de miligramos (mg) de sal (sodio) en una porcin. La mayora de las personas deben limitar la ingesta de sodio total a menos de 2300mg por da.  Siempre consulte la informacin nutricional de los alimentos etiquetados como "con bajo contenido de grasa" o "sin grasa". Estos alimentos pueden tener un mayor contenido de azcar agregada o carbohidratos refinados, y deben evitarse.  Hable con su nutricionista para identificar sus objetivos diarios en cuanto a los nutrientes mencionados en la etiqueta. Al ir de compras  Evite comprar alimentos procesados, enlatados o precocinados. Estos alimentos tienden a tener una mayor cantidad de grasa, sodio y azcar agregada.  Compre en la zona exterior de la tienda de comestibles. Esta zona incluye frutas y verduras frescas, granos a granel, carnes frescas y productos lcteos frescos. Al cocinar  Utilice mtodos de coccin a baja temperatura, como hornear, en lugar de mtodos de coccin a alta temperatura, como frer en abundante aceite.  Cocine con aceites saludables, como el aceite de oliva, canola o girasol.  Evite cocinar con manteca, crema o carnes con alto contenido de grasa. Planificacin de las comidas  Coma las comidas y los refrigerios regularmente, preferentemente a la misma hora todos los das. Evite pasar largos perodos de tiempo sin comer.  Consuma alimentos ricos en fibra, como frutas frescas, verduras, frijoles y cereales integrales. Consulte a su nutricionista sobre cuntas porciones de carbohidratos puede consumir en cada comida.  Consuma entre 4 y 6 onzas (oz) de protenas magras por da, como carnes magras, pollo, pescado, huevos o tofu. Una onza de protena magra equivale a: ? 1 onza de carne, pollo o  pescado. ? 1huevo. ?  taza de tofu.  Coma algunos alimentos por da que contengan grasas saludables, como aguacates, frutos secos, semillas y pescado. Estilo de vida  Controle su nivel de glucemia con regularidad.  Haga actividad fsica habitualmente como se lo haya indicado el mdico. Esto puede incluir lo siguiente: ? 150minutos semanales de ejercicio de intensidad moderada o alta. Esto podra incluir caminatas dinmicas, ciclismo o gimnasia acutica. ? Realizar ejercicios de elongacin y de fortalecimiento, como yoga o levantamiento de pesas, por lo menos 2veces por semana.  Tome los medicamentos como se lo haya indicado el mdico.  No consuma ningn producto que contenga nicotina o tabaco, como cigarrillos y cigarrillos electrnicos. Si necesita ayuda para dejar de fumar, consulte al mdico.  Trabaje con un asesor o instructor en diabetes para identificar estrategias para controlar el estrs y cualquier desafo emocional y social. Preguntas para hacerle al mdico  Es necesario que consulte a un instructor en el cuidado de la diabetes?  Es necesario que me rena con un nutricionista?  A qu nmero puedo llamar si tengo preguntas?  Cules son los mejores momentos para controlar la glucemia? Dnde encontrar ms informacin:  Asociacin Estadounidense de la Diabetes (American Diabetes Association): diabetes.org  Academia de Nutricin y Diettica (Academy of Nutrition and Dietetics): www.eatright.org  Instituto Nacional de la Diabetes y las Enfermedades Digestivas y Renales (National Institute of Diabetes and Digestive and Kidney Diseases, NIH): www.niddk.nih.gov Resumen  Un plan de alimentacin saludable lo ayudar a controlar la glucemia y mantener un estilo de vida saludable.    Trabajar con un especialista en dietas y nutricin (nutricionista) puede ayudarlo a elaborar el mejor plan de alimentacin para usted.  Tenga en cuenta que los carbohidratos (hidratos de  carbono) y el alcohol tienen efectos inmediatos en sus niveles de glucemia. Es importante contar los carbohidratos que ingiere y consumir alcohol con prudencia. Esta informacin no tiene como fin reemplazar el consejo del mdico. Asegrese de hacerle al mdico cualquier pregunta que tenga. Document Revised: 01/27/2017 Document Reviewed: 09/08/2016 Elsevier Patient Education  2020 Elsevier Inc.  

## 2020-11-02 ENCOUNTER — Other Ambulatory Visit (INDEPENDENT_AMBULATORY_CARE_PROVIDER_SITE_OTHER): Payer: Self-pay | Admitting: Primary Care

## 2020-11-02 DIAGNOSIS — I10 Essential (primary) hypertension: Secondary | ICD-10-CM

## 2020-11-02 DIAGNOSIS — E119 Type 2 diabetes mellitus without complications: Secondary | ICD-10-CM

## 2020-11-19 ENCOUNTER — Other Ambulatory Visit: Payer: Self-pay

## 2020-11-19 ENCOUNTER — Ambulatory Visit (INDEPENDENT_AMBULATORY_CARE_PROVIDER_SITE_OTHER): Payer: Self-pay | Admitting: Primary Care

## 2020-11-19 ENCOUNTER — Encounter (INDEPENDENT_AMBULATORY_CARE_PROVIDER_SITE_OTHER): Payer: Self-pay | Admitting: Primary Care

## 2020-11-19 VITALS — BP 130/85 | HR 70 | Temp 97.9°F | Resp 18 | Wt 205.8 lb

## 2020-11-19 DIAGNOSIS — E6609 Other obesity due to excess calories: Secondary | ICD-10-CM

## 2020-11-19 DIAGNOSIS — G8929 Other chronic pain: Secondary | ICD-10-CM

## 2020-11-19 DIAGNOSIS — E7841 Elevated Lipoprotein(a): Secondary | ICD-10-CM

## 2020-11-19 DIAGNOSIS — M25561 Pain in right knee: Secondary | ICD-10-CM

## 2020-11-19 DIAGNOSIS — E119 Type 2 diabetes mellitus without complications: Secondary | ICD-10-CM

## 2020-11-19 DIAGNOSIS — Z6833 Body mass index (BMI) 33.0-33.9, adult: Secondary | ICD-10-CM

## 2020-11-19 DIAGNOSIS — Z9189 Other specified personal risk factors, not elsewhere classified: Secondary | ICD-10-CM

## 2020-11-19 DIAGNOSIS — D649 Anemia, unspecified: Secondary | ICD-10-CM

## 2020-11-19 DIAGNOSIS — N951 Menopausal and female climacteric states: Secondary | ICD-10-CM

## 2020-11-19 DIAGNOSIS — Z23 Encounter for immunization: Secondary | ICD-10-CM

## 2020-11-19 DIAGNOSIS — Z1231 Encounter for screening mammogram for malignant neoplasm of breast: Secondary | ICD-10-CM

## 2020-11-19 DIAGNOSIS — Q8901 Asplenia (congenital): Secondary | ICD-10-CM

## 2020-11-19 DIAGNOSIS — I10 Essential (primary) hypertension: Secondary | ICD-10-CM

## 2020-11-19 MED ORDER — IBUPROFEN 800 MG PO TABS: 800.0000 mg | ORAL_TABLET | Freq: Three times a day (TID) | ORAL | 1 refills | Status: DC | PRN

## 2020-11-19 NOTE — Progress Notes (Signed)
Subjective:  Patient ID: Christine Pineda, female    DOB: 07-10-1965  Age: 55 y.o. MRN: [id number]  CC: DM f/u   HPI Christine Pineda presents for follow-up of diabetes. Patient does not check blood sugar at home and the management of hypertension -Denies shortness of breath, headaches, chest pain or lower extremity edema. She complains of right knee pain that swells at times difficult to walk and increase pain with walks, steps and stiffness feels like a bone pops out of place.   10/10 but intermediate . She is using Tylenol 521m(2)  Compliant with meds - Yes Checking CBGs? No  Fasting avg -   Postprandial average -  Exercising regularly? - Yes Watching carbohydrate intake? - Yes Neuropathy ? - No  Hypoglycemic events - No  - Recovers with :   Pertinent ROS:  Polyuria - No Polydipsia - No Vision problems - no  Medications as noted below. Taking them regularly without complication/adverse reaction being reported today.   History Ms. MBernette Mayersis a 55year old Hispanic female(Christine Pineda has a [place] medical [id number].   She has a past surgical history that includes Tubal ligation (Bilateral, 06/02/2006) and Cesarean section.   Her family history includes Diabetes in her father.She reports that she has never smoked. She has never used smokeless tobacco. She reports that she does not drink alcohol and does not use drugs.  Current Outpatient Medications on File Prior to Visit  Medication Sig Dispense Refill   atorvastatin (LIPITOR) 10 MG tablet Take 1 tablet (10 mg total) by mouth daily. 90 tablet 1   glipiZIDE (GLUCOTROL) 5 MG tablet Take 1 tablet (5 mg total) by mouth 2 (two) times daily before a meal. 180 tablet 1   lisinopril (ZESTRIL) 5 MG tablet Take 1 tablet by mouth once daily 90 tablet 0   metFORMIN (GLUCOPHAGE) 1000 MG tablet Keep appt 11/19/20 prior to further refills. 180 tablet 0   No current  facility-administered medications on file prior to visit.    ROS Review of Systems  Musculoskeletal:        Right knee pain chronic crepitus   All other systems reviewed and are negative.  Objective:  BP 130/85 (BP Location: Right Arm, Patient Position: Sitting, Cuff Size: Normal)   Pulse 70   Temp 97.9 F (36.6 C) (Oral)   Resp 18   Wt 205 lb 12.8 oz (93.4 kg)   LMP 08/14/2020 (Exact Date) Comment: last 3-4 days light - spotting recently not a regular spotting  SpO2 100%   BMI 33.73 kg/m   BP Readings from Last 3 Encounters:  11/19/20 130/85  05/17/20 109/71  02/01/20 114/76    Wt Readings from Last 3 Encounters:  11/19/20 205 lb 12.8 oz (93.4 kg)  05/17/20 202 lb 12.8 oz (92 kg)  02/01/20 198 lb 3.2 oz (89.9 kg)    Physical Exam Vitals reviewed.  Constitutional:      Appearance: She is obese.  HENT:     Head: Normocephalic.     Right Ear: Tympanic membrane normal.     Left Ear: Tympanic membrane normal.     Nose: Nose normal.  Eyes:     Extraocular Movements: Extraocular movements intact.     Pupils: Pupils are equal, round, and reactive to light.  Cardiovascular:     Rate and Rhythm: Normal rate and regular rhythm.  Pulmonary:     Effort: Pulmonary effort is normal.  Breath sounds: Normal breath sounds.  Abdominal:     General: Bowel sounds are normal. There is distension.     Palpations: Abdomen is soft.  Musculoskeletal:        General: Normal range of motion.     Cervical back: Normal range of motion and neck supple.     Comments: Crepitus in right knee  Skin:    General: Skin is warm and dry.  Neurological:     Mental Status: She is alert and oriented to person, place, and time.  Psychiatric:        Mood and Affect: Mood normal.        Behavior: Behavior normal.        Thought Content: Thought content normal.        Judgment: Judgment normal.   Lab Results  Component Value Date   HGBA1C 6.9 (A) 05/17/2020   HGBA1C 6.7 (A) 02/01/2020    HGBA1C 7.3 (A) 11/01/2019    Lab Results  Component Value Date   WBC 6.2 02/01/2020   HGB 10.9 (L) 02/01/2020   HCT 33.2 (L) 02/01/2020   PLT 290 02/01/2020   GLUCOSE 137 (H) 02/01/2020   CHOL 133 11/01/2019   TRIG 71 11/01/2019   HDL 53 11/01/2019   LDLCALC 66 11/01/2019   ALT 13 02/01/2020   AST 13 02/01/2020   NA 140 02/01/2020   K 4.6 02/01/2020   CL 103 02/01/2020   CREATININE 0.62 02/01/2020   BUN 7 02/01/2020   CO2 23 02/01/2020   TSH 1.750 03/11/2018   HGBA1C 6.9 (A) 05/17/2020   MICROALBUR <0.2 08/27/2015     Assessment & Plan:  Diagnoses and all orders for this visit:  Type 2 diabetes mellitus without complication, without long-term current use of insulin ([place])  Goal of therapy: Less than 6.5 hemoglobin A1c.  Decrease foods that are high in carbohydrates are the following rice, potatoes, breads, sugars, tortilla and pastas.  Reduction in the intake (eating) will assist in lowering your blood sugars.  -     Ambulatory referral to Ophthalmology -     Hemoglobin A1c -     CBC with Differential  Essential hypertension Counseled on blood pressure goal of less than 130/80, low-sodium, DASH diet, medication compliance, 150 minutes of moderate intensity exercise per week. Discussed medication compliance, adverse effects. Did not take Bp meds this AM fasting - more than likely < 130/80 -     CMP14+EGFR  Elevated lipoprotein(a) Decrease your fatty foods, red meat, cheese, milk and increase fiber like whole grains and veggies.  -     Lipid Panel  Chronic pain of right knee Knee pain for > year Work on losing weight to help reduce joint pain. May alternate with heat and ice application for pain relief. May also alternate with acetaminophen and Ibuprofen 872m as prescribed pain relief. Other alternatives include massage, acupuncture and water aerobics.  You must stay active and avoid a sedentary lifestyle.   Encounter for screening mammogram for malignant neoplasm of  breast Patient completed application for [facility] while in clinic [facility]  -     MM DIGITAL SCREENING BILATERAL; Future  Anemia, unspecified type Hx of  -     CBC with Differential  23-polyvalent pneumococcal polysaccharide vaccine indication of asplenia in patient 661to 53years of age PPrevnar 91 Perimenopausal symptoms Irregular menstrual cycles last cycle 3  months explain menopause  Menopause is a natural decline in reproductive hormones in women that reach their 68s to 60s.  This is the absence of a menstrual cycle for 12 months.  Symptoms can include fatigue, night sweats, hot flashes, or sweating.  Also, common features anxiety, dry skin, irritability, moodiness, reduced sex drive, or vaginal dryness.   Other orders -     ibuprofen (ADVIL) 800 MG tablet; Take 1 tablet (800 mg total) by mouth every 8 (eight) hours as needed.  Class 1 obesity due to excess calories without serious comorbidity with body mass index (BMI) of 33.0 to 33.9 in adult Obesity is 30-39 indicating an excess in caloric intake or underlining conditions. This may lead to other co-morbidities. Lifestyle modifications of diet and exercise may reduce obesity.   I am having Kashana Pineda maintain her atorvastatin, glipiZIDE, lisinopril, metFORMIN, and ibuprofen.  Meds ordered this encounter  Medications   ibuprofen (ADVIL) 800 MG tablet    Sig: Take 1 tablet (800 mg total) by mouth every 8 (eight) hours as needed.    Dispense:  90 tablet    Refill:  1     Follow-up:   Return in about 6 months (around 05/21/2021) for HTN?DM.  The above assessment and management plan was discussed with the patient. The patient verbalized understanding of and has agreed to the management plan. Patient is aware to call the clinic if symptoms fail to improve or worsen. Patient is aware when to return to the clinic for a  follow-up visit. Patient educated on when it is appropriate to go to the emergency department.   Juluis Mire, NP-C

## 2020-11-19 NOTE — Patient Instructions (Signed)
Dolor de rodilla crnico en los adultos Chronic Knee Pain, Adult El dolor de rodilla que dura ms de 3 meses se denomina dolor de rodilla crnico. Puede sentir dolor en una o en ambas rodillas. Los sntomas de dolor de rodilla crnico tambin pueden incluir hinchazn y rigidez. La causa ms frecuente es el desgaste de la articulacin de la rodilla (osteoartritis) relacionado con la edad. Muchas afecciones pueden causar dolor crnico en lasrodillas. El tratamiento depende de la causa. Los tratamientos principales son la fisioterapia y la prdida de [place]. Tambin puede tratarse con medicamentos, inyecciones, una rodillera o un dispositivo ortopdico, y el uso de [place]. Adems, puede recomendarse reposo, hielo, presin (compresin) y elevacin, lo que tambin se conoce como terapia RHCE. Siga estas instrucciones en su casa: Si tiene una rodillera o un dispositivo ortopdico:  Use la rodillera o el dispositivo ortopdico como se lo haya indicado el mdico. Quteselos solamente como se lo haya indicado el mdico. Afljeselos si los dedos del pie: [place]orm[place]. Se adormecen. Se tornan fros y de [employer]. Mantngalos limpios. Si la rodillera o el dispositivo ortopdico no son impermeables: No deje que se mojen. Pregntele al mdico si puede quitrselos cuando toma un bao de inmersin o [place]. Si no es as, use una cobertura impermeable.  Control del dolor, la rigidez y la hinchazn     Si se lo indican, aplique calor sobre la rodilla. Hgalo con la frecuencia que le haya indicado el mdico. Use la fuente de calor que el mdico le recomiende, como una compresa de calor hmedo o una almohadilla trmica. Si tiene una rodillera o un dispositivo ortopdico que se puede quitar, proceda como se lo haya indicado el mdico. Coloque una toalla entre la piel y la fuente de Freight forwarder. Aplique calor durante 20 a 30 minutos. Retire la fuente de calor si la piel se le pone de color rojo brillante. Esto es [employer]. Si no puede sentir dolor, calor o fro, tiene un mayor riesgo de [place]. Si se lo indican, aplique hielo sobre la rodilla. Para hacer esto: Si tiene una rodillera o un dispositivo ortopdico que se puede quitar, proceda como se lo haya indicado el mdico. Ponga el hielo en una bolsa plstica. Coloque una toalla entre la piel y Therapist, nutritional. Aplique el hielo durante 20 minutos, 2 o 3 veces por da. Retire el hielo si la piel se le pone de color rojo brillante. Esto es [employer]. Si no puede sentir dolor, calor o fro, tiene un mayor riesgo de que se dae la zona. Mueva los dedos del pie con frecuencia. Cuando est sentado o acostado, mantenga la zona lesionada por encima del nivel del corazn. Actividad Evite las actividades en las que ambos pies no estn en contacto con el piso al mismo tiempo (actividades de alto impacto). Algunos ejemplos son correr, Art therapist soga y hacer saltos de tijera. Siga el plan de ejercicios que Warden/ranger. El mdico puede sugerirle que haga lo siguiente: Product/process development scientist las actividades que empeoren el dolor de rodilla. Es posible que deba cambiar los ejercicios que hace, los deportes que practica o sus obligaciones laborales. Usar calzado con suelas acolchonadas. Evitar deportes que requieran correr y [place] manager de direccin repentinamente. Realizar ejercicios o fisioterapia. Esto se planifica en funcin de sus necesidades y capacidades. Haga ejercicios que mejoren el equilibrio y la fuerza, [place] tai chi y el yoga. No apoye el peso del cuerpo sobre la rodilla lesionada hasta que el mdico lo  autorice. Utilice las [employer] se lo haya indicado el mdico. Regrese a sus actividades normales cuando el mdico le diga que es seguro. Indicaciones generales Use los medicamentos de venta libre y los recetados solamente como se lo haya indicado el mdico. Si tiene sobrepeso, trabaje con su mdico y un experto en alimentos (nutricionista) para establecer  metas para bajar de peso. El sobrepeso puede aumentar el dolor de rodilla. No fume ni consuma ningn producto que contenga nicotina o tabaco. Si necesita ayuda para dejar de consumir estos productos, consulte al mdico. Cumpla con todas las visitas de seguimiento. Comunquese con un mdico si: Tiene un dolor de rodilla que no mejora o que [place]. No puede hacer los ejercicios debido al dolor de rodilla. Solicite ayuda de inmediato si: La rodilla se hincha y la hinchazn empeora. No puede mover la rodilla. Siente mucho dolor en la rodilla. Resumen El dolor de rodilla que dura ms de 3 meses se denomina dolor de rodilla crnico. Los tratamientos principales para el dolor de rodilla crnico son la fisioterapia y la prdida de [place]. Tambin es posible que deba tomar medicamentos, usar una rodillera o un dispositivo ortopdico, usar muletas y aplicarse hielo o calor en la rodilla. Baje de peso si es necesario. Trabaje con su mdico y un experto en alimentacin (nutricionista) para que le ayuden a Conservation officer, nature para Sports coach de [place]. El sobrepeso puede aumentar el dolor de rodilla. Siga el plan de ejercicios que Warden/ranger. Esta informacin no tiene Marine scientist el consejo del mdico. Asegresede hacerle al mdico cualquier pregunta que tenga. Document Revised: 12/20/2019 Document Reviewed: 12/20/2019 Elsevier Patient Education  [place].

## 2020-11-20 ENCOUNTER — Other Ambulatory Visit (INDEPENDENT_AMBULATORY_CARE_PROVIDER_SITE_OTHER): Payer: Self-pay | Admitting: Primary Care

## 2020-11-20 DIAGNOSIS — E782 Mixed hyperlipidemia: Secondary | ICD-10-CM

## 2020-11-20 DIAGNOSIS — D649 Anemia, unspecified: Secondary | ICD-10-CM

## 2020-11-20 DIAGNOSIS — E119 Type 2 diabetes mellitus without complications: Secondary | ICD-10-CM

## 2020-11-20 LAB — CBC WITH DIFFERENTIAL/PLATELET
Basophils Absolute: 0 10*3/uL (ref 0.0–0.2)
Basos: 0 %
EOS (ABSOLUTE): 0.1 10*3/uL (ref 0.0–0.4)
Eos: 1 %
Hematocrit: 33 % — ABNORMAL LOW (ref 34.0–46.6)
Hemoglobin: 10.4 g/dL — ABNORMAL LOW (ref 11.1–15.9)
Immature Grans (Abs): 0 10*3/uL (ref 0.0–0.1)
Immature Granulocytes: 0 %
Lymphocytes Absolute: 1.7 10*3/uL (ref 0.7–3.1)
Lymphs: 23 %
MCH: 27.2 pg (ref 26.6–33.0)
MCHC: 31.5 g/dL (ref 31.5–35.7)
MCV: 86 fL (ref 79–97)
Monocytes Absolute: 0.4 10*3/uL (ref 0.1–0.9)
Monocytes: 5 %
Neutrophils Absolute: 5.2 10*3/uL (ref 1.4–7.0)
Neutrophils: 71 %
Platelets: 282 10*3/uL (ref 150–450)
RBC: 3.83 x10E6/uL (ref 3.77–5.28)
RDW: 16.7 % — ABNORMAL HIGH (ref 11.7–15.4)
WBC: 7.3 10*3/uL (ref 3.4–10.8)

## 2020-11-20 LAB — CMP14+EGFR
ALT: 12 IU/L (ref 0–32)
AST: 10 IU/L (ref 0–40)
Albumin/Globulin Ratio: 1.7 (ref 1.2–2.2)
Albumin: 4.7 g/dL (ref 3.8–4.9)
Alkaline Phosphatase: 71 IU/L (ref 44–121)
BUN/Creatinine Ratio: 16 (ref 9–23)
BUN: 9 mg/dL (ref 6–24)
Bilirubin Total: <0.2 mg/dL (ref 0.0–1.2)
CO2: 24 mmol/L (ref 20–29)
Calcium: 9.6 mg/dL (ref 8.7–10.2)
Chloride: 100 mmol/L (ref 96–106)
Creatinine, Ser: 0.58 mg/dL (ref 0.57–1.00)
Globulin, Total: 2.8 g/dL (ref 1.5–4.5)
Glucose: 157 mg/dL — ABNORMAL HIGH (ref 65–99)
Potassium: 4.9 mmol/L (ref 3.5–5.2)
Sodium: 140 mmol/L (ref 134–144)
Total Protein: 7.5 g/dL (ref 6.0–8.5)
eGFR: 108 mL/min/{1.73_m2} (ref >59)

## 2020-11-20 LAB — LIPID PANEL
Chol/HDL Ratio: 2.9 ratio (ref 0.0–4.4)
Cholesterol, Total: 149 mg/dL (ref 100–199)
HDL: 52 mg/dL (ref >39)
LDL Chol Calc (NIH): 79 mg/dL (ref 0–99)
Triglycerides: 98 mg/dL (ref 0–149)
VLDL Cholesterol Cal: 18 mg/dL (ref 5–40)

## 2020-11-20 LAB — HEMOGLOBIN A1C
Est. average glucose Bld gHb Est-mCnc: 177 mg/dL
Hgb A1c MFr Bld: 7.8 % — ABNORMAL HIGH (ref 4.8–5.6)

## 2020-11-20 MED ORDER — ATORVASTATIN CALCIUM 10 MG PO TABS: 10.0000 mg | ORAL_TABLET | Freq: Every day | ORAL | 1 refills | Status: DC

## 2020-11-20 MED ORDER — FERROUS SULFATE 325 (65 FE) MG PO TABS: 325.0000 mg | ORAL_TABLET | Freq: Every day | ORAL | 3 refills | Status: DC

## 2020-11-20 MED ORDER — GLIPIZIDE 10 MG PO TABS: 10.0000 mg | ORAL_TABLET | Freq: Two times a day (BID) | ORAL | 3 refills | Status: DC

## 2020-11-20 MED ORDER — METFORMIN HCL 1000 MG PO TABS: ORAL_TABLET | ORAL | 1 refills | Status: DC

## 2020-11-21 ENCOUNTER — Telehealth (INDEPENDENT_AMBULATORY_CARE_PROVIDER_SITE_OTHER): Payer: Self-pay

## 2020-11-21 NOTE — Telephone Encounter (Signed)
Copied from CRM [id number]. Topic: General - Other >> Nov 20, 2020  5:52 PM Pawlus, Maxine Glenn A wrote: Reason for CRM: [facility] called in regarding metFORMIN (GLUCOPHAGE) 1000 MG tablet, pharmacist stated there are no directions listed on the prescription, please advise.

## 2020-11-24 ENCOUNTER — Other Ambulatory Visit (INDEPENDENT_AMBULATORY_CARE_PROVIDER_SITE_OTHER): Payer: Self-pay | Admitting: Primary Care

## 2020-11-24 DIAGNOSIS — E119 Type 2 diabetes mellitus without complications: Secondary | ICD-10-CM

## 2020-11-24 MED ORDER — METFORMIN HCL 1000 MG PO TABS: ORAL_TABLET | ORAL | 1 refills | Status: DC

## 2020-11-24 NOTE — Telephone Encounter (Signed)
Sent Tome un comprimido despus del desayuno y el segundo comprimido despus de la cena.

## 2020-11-30 ENCOUNTER — Other Ambulatory Visit (INDEPENDENT_AMBULATORY_CARE_PROVIDER_SITE_OTHER): Payer: Self-pay | Admitting: Primary Care

## 2020-11-30 DIAGNOSIS — E119 Type 2 diabetes mellitus without complications: Secondary | ICD-10-CM

## 2020-11-30 NOTE — Telephone Encounter (Signed)
Copied from CRM [id number]. Topic: Quick Communication - Rx Refill/Question >> Nov 30, 2020 10:44 AM Gaetana Michaelis A wrote: Medication: metFORMIN (GLUCOPHAGE) 1000 MG tablet   Has the patient contacted their pharmacy? Yes.   (Agent: If no, request that the patient contact the pharmacy for the refill.) (Agent: If yes, when and what did the pharmacy advise?)  Preferred Pharmacy (with phone number or street name): metFORMIN (GLUCOPHAGE) 1000 MG tablet   Agent: Please be advised that RX refills may take up to 3 business days. We ask that you follow-up with your pharmacy.

## 2020-12-07 NOTE — Telephone Encounter (Signed)
Pt called asking about the prescription of her Metformin.  She has not heard anything about it being filled and is out.

## 2020-12-26 ENCOUNTER — Other Ambulatory Visit (INDEPENDENT_AMBULATORY_CARE_PROVIDER_SITE_OTHER): Payer: Self-pay | Admitting: Primary Care

## 2020-12-26 DIAGNOSIS — E119 Type 2 diabetes mellitus without complications: Secondary | ICD-10-CM

## 2020-12-26 NOTE — Telephone Encounter (Signed)
Copied from CRM [id number]. Topic: Quick Communication - Rx Refill/Question >> Dec 26, 2020  3:58 PM Gaetana Michaelis A wrote: Medication: metFORMIN (GLUCOPHAGE) 1000 MG tablet   Has the patient contacted their pharmacy? No. (Agent: If no, request that the patient contact the pharmacy for the refill.) (Agent: If yes, when and what did the pharmacy advise?)  Preferred Pharmacy (with phone number or street name):[facility] - [place] - [address] [place] ([place]) [place] [address] Phone: [phone] Fax: [phone]    Agent: Please be advised that RX refills may take up to 3 business days. We ask that you follow-up with your pharmacy.

## 2020-12-27 MED ORDER — METFORMIN HCL 1000 MG PO TABS: ORAL_TABLET | ORAL | 1 refills | Status: DC

## 2021-01-04 ENCOUNTER — Other Ambulatory Visit (INDEPENDENT_AMBULATORY_CARE_PROVIDER_SITE_OTHER): Payer: Self-pay | Admitting: Primary Care

## 2021-01-04 ENCOUNTER — Telehealth (INDEPENDENT_AMBULATORY_CARE_PROVIDER_SITE_OTHER): Payer: Self-pay | Admitting: Primary Care

## 2021-01-04 DIAGNOSIS — E119 Type 2 diabetes mellitus without complications: Secondary | ICD-10-CM

## 2021-01-04 NOTE — Telephone Encounter (Signed)
Medication: metFORMIN (GLUCOPHAGE) 1000 MG tablet [[id number]] - Pt called stating that the pharmacy does not have this medication. Can this be resent please?  Has the patient contacted their pharmacy? YES (Agent: If no, request that the patient contact the pharmacy for the refill.) (Agent: If yes, when and what did the pharmacy advise?)  Preferred Pharmacy (with phone number or street name): [facility] - [place] ([place]), [place] - [address]  [address] [place] ([place]) [place] [address]  Phone:  [phone]  Fax:  [phone]  Agent: Please be advised that RX refills may take up to 3 business days. We ask that you follow-up with your pharmacy.

## 2021-01-04 NOTE — Telephone Encounter (Signed)
Spoke with pharmacist, states pt was just there and picked up medication. States they did have it available.

## 2021-02-12 ENCOUNTER — Other Ambulatory Visit (INDEPENDENT_AMBULATORY_CARE_PROVIDER_SITE_OTHER): Payer: Self-pay | Admitting: Primary Care

## 2021-02-12 DIAGNOSIS — I10 Essential (primary) hypertension: Secondary | ICD-10-CM

## 2021-02-19 ENCOUNTER — Ambulatory Visit (INDEPENDENT_AMBULATORY_CARE_PROVIDER_SITE_OTHER): Payer: Self-pay | Admitting: Primary Care

## 2021-02-22 ENCOUNTER — Ambulatory Visit (INDEPENDENT_AMBULATORY_CARE_PROVIDER_SITE_OTHER): Payer: Self-pay | Admitting: Primary Care

## 2021-02-22 ENCOUNTER — Other Ambulatory Visit: Payer: Self-pay

## 2021-02-22 ENCOUNTER — Encounter (INDEPENDENT_AMBULATORY_CARE_PROVIDER_SITE_OTHER): Payer: Self-pay | Admitting: Primary Care

## 2021-02-22 VITALS — BP 116/73 | HR 72 | Temp 97.5°F | Ht 65.5 in | Wt 211.4 lb

## 2021-02-22 DIAGNOSIS — I10 Essential (primary) hypertension: Secondary | ICD-10-CM

## 2021-02-22 DIAGNOSIS — Z23 Encounter for immunization: Secondary | ICD-10-CM

## 2021-02-22 DIAGNOSIS — E782 Mixed hyperlipidemia: Secondary | ICD-10-CM

## 2021-02-22 DIAGNOSIS — E119 Type 2 diabetes mellitus without complications: Secondary | ICD-10-CM

## 2021-02-22 LAB — POCT GLYCOSYLATED HEMOGLOBIN (HGB A1C): Hemoglobin A1C: 6.3 % — AB (ref 4.0–5.6)

## 2021-02-22 MED ORDER — ATORVASTATIN CALCIUM 10 MG PO TABS: 10.0000 mg | ORAL_TABLET | Freq: Every day | ORAL | 1 refills | Status: DC

## 2021-02-22 MED ORDER — METFORMIN HCL 1000 MG PO TABS: ORAL_TABLET | ORAL | 1 refills | Status: DC

## 2021-02-22 MED ORDER — LISINOPRIL 5 MG PO TABS: ORAL_TABLET | ORAL | 0 refills | Status: DC

## 2021-02-24 NOTE — Progress Notes (Signed)
Established Patient Office Visit  Subjective:  Patient ID: Christine Pineda, female    DOB: 28-Apr-1966  Age: 55 y.o. MRN: [id number]  CC:  Chief Complaint  Patient presents with   Hypertension   Diabetes    HPI Christine Pineda is 55 year old Hispanic female presents for the management of HTN and diabetes. Denies shortness of breath, headaches, chest pain or lower extremity edema . Hypoglycemic episodes:no Polydipsia/polyuria: no Visual disturbance: no Chest pain: no Paresthesias: no  Past Medical History:  Diagnosis Date   Diabetes mellitus without complication ([place]) 3474    Past Surgical History:  Procedure Laterality Date   CESAREAN SECTION     for macrosomia   TUBAL LIGATION Bilateral 06/02/2006   Performed at [facility], however, can't find record    Family History  Problem Relation Age of Onset   Diabetes Father    Breast cancer Neg Hx     Social History   Socioeconomic History   Marital status: Single    Spouse name: Not on file   Number of children: Not on file   Years of education: Not on file   Highest education level: Not on file  Occupational History   Not on file  Tobacco Use   Smoking status: Never   Smokeless tobacco: Never  Vaping Use   Vaping Use: Never used  Substance and Sexual Activity   Alcohol use: No   Drug use: No   Sexual activity: Yes    Birth control/protection: Surgical  Other Topics Concern   Not on file  Social History Narrative   Not on file   Social Determinants of Health   Financial Resource Strain: Not on file  Food Insecurity: Not on file  Transportation Needs: Not on file  Physical Activity: Not on file  Stress: Not on file  Social Connections: Not on file  Intimate Partner Violence: Not on file    Outpatient Medications Prior to Visit  Medication Sig Dispense Refill   ferrous sulfate 325 (65 FE) MG tablet Take 1 tablet (325 mg total) by mouth daily with breakfast. 90 tablet 3    ibuprofen (ADVIL) 800 MG tablet Take 1 tablet (800 mg total) by mouth every 8 (eight) hours as needed. 90 tablet 1   atorvastatin (LIPITOR) 10 MG tablet Take 1 tablet (10 mg total) by mouth daily. 90 tablet 1   glipiZIDE (GLUCOTROL) 10 MG tablet Take 1 tablet (10 mg total) by mouth 2 (two) times daily before a meal. 60 tablet 3   lisinopril (ZESTRIL) 5 MG tablet TAKE 1 TABLET BY MOUTH ONCE DAILY . KEEP APPOINTMENT 11/19/2020 FOR FUTURE REFILLS 90 tablet 0   metFORMIN (GLUCOPHAGE) 1000 MG tablet Tome un comprimido despu s del desayuno y el segundo comprimido despu s de la cena. 180 tablet 1   No facility-administered medications prior to visit.    No Known Allergies  ROS Review of Systems  All other systems reviewed and are negative.    Objective:  BP 116/73 (BP Location: Right Arm, Patient Position: Sitting, Cuff Size: Large)   Pulse 72   Temp (!) 97.5 F (36.4 C) (Temporal)   Ht 5' 5.5" (1.664 m)   Wt 211 lb 6.4 oz (95.9 kg)   LMP 08/14/2020 (Exact Date) Comment: last 3-4 days light - spotting recently not a regular spotting  SpO2 97%   BMI 34.64 kg/m  Wt Readings from Last 3 Encounters:  02/22/21 211 lb 6.4 oz (95.9 kg)  11/19/20 205 lb  12.8 oz (93.4 kg)  05/17/20 202 lb 12.8 oz (92 kg)   Physical Exam General: Vital signs reviewed.  Patient is obese female well-developed and well-nourished, in no acute distress and cooperative with exam.  Head: Normocephalic and atraumatic. Eyes: EOMI, conjunctivae normal, no scleral icterus.  Neck: Supple, trachea midline, normal ROM, no JVD, masses, thyromegaly, or carotid bruit present.  Cardiovascular: RRR, S1 normal, S2 normal, no murmurs, gallops, or rubs. Pulmonary/Chest: Clear to auscultation bilaterally, no wheezes, rales, or rhonchi. Abdominal: Soft, non-tender, non-distended, BS +, no masses, organomegaly, or guarding present.  Musculoskeletal: No joint deformities, erythema, or stiffness, ROM full and nontender. Extremities: No  lower extremity edema bilaterally,  pulses symmetric and intact bilaterally. No cyanosis or clubbing. Neurological: A&O x3, Strength is normal and symmetric bilaterally,no focal motor deficit, sensory intact to light touch bilaterally.  Skin: Warm, dry and intact. No rashes or erythema. Psychiatric: Normal mood and affect. speech and behavior is normal. Cognition and memory are normal.   Health Maintenance Due  Topic Date Due   Meningococcal B Vaccine (1 of 4 - Increased Risk Bexsero 2-dose series) Never done   Zoster Vaccines- Shingrix (1 of 2) Never done   OPHTHALMOLOGY EXAM  02/16/2014   MAMMOGRAM  05/06/2020   COVID-19 Vaccine (4 - Booster) 05/24/2020   FOOT EXAM  10/31/2020   PAP SMEAR-Modifier  03/11/2021    There are no preventive care reminders to display for this patient.  Lab Results  Component Value Date   TSH 1.750 03/11/2018   Lab Results  Component Value Date   WBC 7.3 11/19/2020   HGB 10.4 (L) 11/19/2020   HCT 33.0 (L) 11/19/2020   MCV 86 11/19/2020   PLT 282 11/19/2020   Lab Results  Component Value Date   NA 140 11/19/2020   K 4.9 11/19/2020   CO2 24 11/19/2020   GLUCOSE 157 (H) 11/19/2020   BUN 9 11/19/2020   CREATININE 0.58 11/19/2020   BILITOT <0.2 11/19/2020   ALKPHOS 71 11/19/2020   AST 10 11/19/2020   ALT 12 11/19/2020   PROT 7.5 11/19/2020   ALBUMIN 4.7 11/19/2020   CALCIUM 9.6 11/19/2020   EGFR 108 11/19/2020   Lab Results  Component Value Date   CHOL 149 11/19/2020   Lab Results  Component Value Date   HDL 52 11/19/2020   Lab Results  Component Value Date   LDLCALC 79 11/19/2020   Lab Results  Component Value Date   TRIG 98 11/19/2020   Lab Results  Component Value Date   CHOLHDL 2.9 11/19/2020   Lab Results  Component Value Date   HGBA1C 6.3 (A) 02/22/2021      Assessment & Plan:  Rucha was seen today for hypertension and diabetes.  Diagnoses and all orders for this visit:  Type 2 diabetes mellitus without  complication, without long-term current use of insulin (HCC) -     HgB A1c 6.3 She is at therapeutic goals for glycemic control related to A1c measurements: Goal of therapy: Less than 6.5 hemoglobin A1c.  Continue foods that are high in carbohydrates are the following rice, potatoes, breads, sugars, and pastas.  Reduction in the intake (eating) will assist in lowering your blood sugars.  -     metFORMIN (GLUCOPHAGE) 1000 MG tablet; Tome un comprimido despu s del desayuno y el segundo comprimido despu s de la cena.  Mixed hyperlipidemia  Healthy lifestyle diet of fruits vegetables fish nuts whole grains and low saturated fat . Foods high  in cholesterol or liver, fatty meats,cheese, butter avocados, nuts and seeds, chocolate and fried foods. -     atorvastatin (LIPITOR) 10 MG tablet; Take 1 tablet (10 mg total) by mouth daily.  Essential hypertension Renal protection  -     lisinopril (ZESTRIL) 5 MG tablet; TAKE 1 TABLET BY MOUTH ONCE DAILY . KEEP APPOINTMENT 11/19/2020 FOR FUTURE REFILLS  Need for immunization against influenza -     Flu Vaccine QUAD 35moIM (Fluarix, Fluzone & Alfiuria Quad PF)   Meds ordered this encounter  Medications   atorvastatin (LIPITOR) 10 MG tablet    Sig: Take 1 tablet (10 mg total) by mouth daily.    Dispense:  90 tablet    Refill:  1   lisinopril (ZESTRIL) 5 MG tablet    Sig: TAKE 1 TABLET BY MOUTH ONCE DAILY . KEEP APPOINTMENT 11/19/2020 FOR FUTURE REFILLS    Dispense:  90 tablet    Refill:  0   metFORMIN (GLUCOPHAGE) 1000 MG tablet    Sig: Tome un comprimido despu s del desayuno y el segundo comprimido despu s de la cena.    Dispense:  180 tablet    Refill:  1    Follow-up: Return in about 6 months (around 08/22/2021) for DM/fasting labs .    MKerin Perna NP

## 2021-03-02 NOTE — Patient Instructions (Signed)
Influenza Virus Vaccine injection (Fluarix) Qu es este medicamento? La VACUNA ANTIGRIPAL ayuda a disminuir el riesgo de contraer la influenza, tambin conocida como la gripe. La vacuna solo ayuda a protegerle contra algunas cepas de influenza. Esta vacuna no ayuda a reducir Nurse, adult de contraer influenza pandmica H1N1. Este medicamento puede ser utilizado para otros usos; si tiene alguna pregunta consulte con su proveedor de atencin mdica o con su farmacutico. MARCAS COMUNES: Fluarix, Fluzone Qu le debo informar a mi profesional de la salud antes de tomar este medicamento? Necesita saber si usted presenta alguno de los siguientes problemas o situaciones: trastorno de sangrado como hemofilia fiebre o infeccin sndrome de Guillain-Barre u otros problemas neurolgicos problemas del sistema inmunolgico infeccin por el virus de la inmunodeficiencia humana (VIH) o SIDA niveles bajos de plaquetas en la sangre esclerosis mltiple una reaccin Counselling psychologist o inusual a las vacunas antigripales, a los huevos, protenas de pollo, al ltex, a la gentamicina, a otros medicamentos, alimentos, colorantes o conservantes si est embarazada o buscando quedar embarazada si est amamantando a un beb Cmo debo [employer]? Esta vacuna se administra mediante inyeccin por va intramuscular. Lo administra un profesional de [employer]. Recibir una copia de informacin escrita sobre la vacuna antes de cada vacuna. Asegrese de leer este folleto cada vez cuidadosamente. Este folleto puede cambiar con frecuencia. Hable con su pediatra para informarse acerca del uso de este medicamento en nios. Puede requerir atencin especial. Sobredosis: Pngase en contacto inmediatamente con un centro toxicolgico o una sala de urgencia si usted cree que haya tomado demasiado medicamento. ATENCIN: [employer] es solo para usted. No comparta este medicamento con nadie. Qu sucede si me olvido de una  dosis? No se aplica en este caso. Qu puede interactuar con este medicamento? quimioterapia o radioterapia medicamentos que suprimen el sistema inmunolgico, tales como etanercept, anakinra, infliximab y adalimumab medicamentos que tratan o previenen cogulos sanguneos, como warfarina fenitona medicamentos esteroideos, como la prednisona o la cortisona teofilina vacunas Puede ser que esta lista no menciona todas las posibles interacciones. Informe a su profesional de [employer] de [employer] productos a base de hierbas, medicamentos de [place] o suplementos nutritivos que est tomando. Si usted fuma, consume bebidas alcohlicas o si utiliza drogas ilegales, indqueselo tambin a su profesional de [employer]. Algunas sustancias pueden interactuar con su medicamento. A qu debo estar atento al usar [employer]? Informe a su mdico o a Producer, television/film/video de la [employer] todos los efectos secundarios que persistan despus de [address]. Llame a su proveedor de atencin mdica si se presentan sntomas inusuales dentro de las 6 semanas posteriores a la vacunacin. Es posible que todava pueda contraer la gripe, pero la enfermedad no ser tan fuerte como normalmente. No puede contraer la gripe de esta vacuna. La vacuna antigripal no le protege contra resfros u otras enfermedades que pueden causar [place]. Debe vacunarse cada ao. Qu efectos secundarios puedo tener al [employer] este medicamento? Efectos secundarios que debe informar a su mdico o a Producer, television/film/video de la salud tan pronto como sea posible: Therapist, art como erupcin cutnea, picazn o urticarias, hinchazn de la cara, labios o lengua Efectos secundarios que, por lo general, no requieren atencin mdica (debe informarlos a su mdico o a su profesional de la salud si persisten o si son molestos): fiebre dolor de cabeza molestias y Risk analyst, sensibilidad, enrojecimiento o Paramedic de la  inyeccin cansancio o debilidad Puede ser que esta lista no  menciona todos los posibles efectos secundarios. Comunquese a su mdico por asesoramiento mdico [employer]. Usted puede informar los efectos secundarios a la FDA por telfono al 1-800-FDA-1088. Dnde debo guardar mi medicina? Esta vacuna se administra solamente en clnicas, farmacias, consultorio mdico u otro consultorio de un profesional de la salud y no Teacher, early years/pre en su domicilio. ATENCIN: Este folleto es un resumen. Puede ser que no cubra toda la posible informacin. Si usted tiene preguntas acerca de esta medicina, consulte con su mdico, su farmacutico o su profesional de Radiographer, therapeutic.  2022 Elsevier/Gold Standard (2009-11-20 15:31:40)

## 2021-03-13 ENCOUNTER — Other Ambulatory Visit: Payer: Self-pay | Admitting: Primary Care

## 2021-03-13 DIAGNOSIS — Z1231 Encounter for screening mammogram for malignant neoplasm of breast: Secondary | ICD-10-CM

## 2021-03-28 ENCOUNTER — Ambulatory Visit
Admission: RE | Admit: 2021-03-28 | Discharge: 2021-03-28 | Disposition: A | Discharge status: Home / Self Care | Payer: No Typology Code available for payment source | Source: Ambulatory Visit | Attending: Primary Care | Admitting: Primary Care

## 2021-03-28 ENCOUNTER — Other Ambulatory Visit: Payer: Self-pay

## 2021-03-28 DIAGNOSIS — Z1231 Encounter for screening mammogram for malignant neoplasm of breast: Secondary | ICD-10-CM

## 2021-05-01 ENCOUNTER — Other Ambulatory Visit: Payer: Self-pay | Admitting: *Deleted

## 2021-05-01 ENCOUNTER — Other Ambulatory Visit: Payer: Self-pay

## 2021-05-01 DIAGNOSIS — Z124 Encounter for screening for malignant neoplasm of cervix: Secondary | ICD-10-CM

## 2021-05-01 NOTE — Progress Notes (Signed)
Patient: Christine Pineda           Date of Birth: [birth year]           MRN: [id number] Visit Date: 05/01/2021 PCP: Grayce Sessions, NP  Cervical Cancer Screening Do you smoke?: No Have you ever had or been told you have an allergy to latex products?: No Marital status: Single Date of last pap smear: 2-5 yrs ago (03/11/2018-Negative [facility])) Date of last menstrual period:  (Postmenopausal-) Number of pregnancies: 2 Number of births: 2 Have you ever had any of the following? Hysterectomy: No Tubal ligation (tubes tied): No Abnormal bleeding: No Abnormal pap smear: No Venereal warts: No A sex partner with venereal warts: No A high risk* sex partner: No  Cervical Exam  Abnormal Observations: Normal Exam. Recommendations: Last Pap smear was 03/11/2018 at [facility] and normal. Per patient has no history of an abnormal Pap smear. Last Pap smear result is available in Epic. Let patient know will follow-up with her within the next couple of weeks with results of her Pap smear by phone. Informed patient that if today's Pap smear is normal and HPV negative that her next Pap smear will be due in 5 years. Patient verbalized understanding.   Used Spanish interpreter Thomasene Mohair from [place].  Patient's History Patient Active Problem List   Diagnosis Date Noted   Well woman exam without gynecological exam 02/20/2013   DM type 2 (diabetes mellitus, type 2) (HCC) 06/23/2012   Past Medical History:  Diagnosis Date   Diabetes mellitus without complication (HCC) 2008    Family History  Problem Relation Age of Onset   Diabetes Father    Breast cancer Neg Hx     Social History   Occupational History   Not on file  Tobacco Use   Smoking status: Never   Smokeless tobacco: Never  Vaping Use   Vaping Use: Never used  Substance and Sexual Activity   Alcohol use: No   Drug use: No   Sexual activity: Yes    Birth control/protection: Surgical

## 2021-05-03 LAB — CYTOLOGY - PAP
Comment: NEGATIVE
Diagnosis: NEGATIVE
High risk HPV: NEGATIVE

## 2021-05-09 ENCOUNTER — Telehealth: Payer: Self-pay

## 2021-05-09 NOTE — Telephone Encounter (Signed)
05/08/2021, Via Natale Lay, Spanish Interpreter [facility]), Patient informed negative Pap/HPV results, next pap due in 5 years. Patient verbalized understanding.

## 2021-05-21 ENCOUNTER — Ambulatory Visit (INDEPENDENT_AMBULATORY_CARE_PROVIDER_SITE_OTHER): Payer: Self-pay | Admitting: Primary Care

## 2021-08-22 ENCOUNTER — Encounter (INDEPENDENT_AMBULATORY_CARE_PROVIDER_SITE_OTHER): Payer: Self-pay | Admitting: Primary Care

## 2021-08-22 ENCOUNTER — Ambulatory Visit (INDEPENDENT_AMBULATORY_CARE_PROVIDER_SITE_OTHER): Payer: Self-pay | Admitting: Primary Care

## 2021-08-22 ENCOUNTER — Other Ambulatory Visit: Payer: Self-pay

## 2021-08-22 VITALS — BP 125/86 | HR 75 | Temp 97.9°F | Ht 65.5 in | Wt 215.2 lb

## 2021-08-22 DIAGNOSIS — I10 Essential (primary) hypertension: Secondary | ICD-10-CM

## 2021-08-22 DIAGNOSIS — Z23 Encounter for immunization: Secondary | ICD-10-CM

## 2021-08-22 DIAGNOSIS — E119 Type 2 diabetes mellitus without complications: Secondary | ICD-10-CM

## 2021-08-22 DIAGNOSIS — E782 Mixed hyperlipidemia: Secondary | ICD-10-CM

## 2021-08-22 DIAGNOSIS — D649 Anemia, unspecified: Secondary | ICD-10-CM

## 2021-08-22 LAB — POCT GLYCOSYLATED HEMOGLOBIN (HGB A1C): Hemoglobin A1C: 7.1 % — AB (ref 4.0–5.6)

## 2021-08-22 MED ORDER — METFORMIN HCL 1000 MG PO TABS: ORAL_TABLET | ORAL | 1 refills | Status: DC

## 2021-08-22 MED ORDER — GLIPIZIDE 10 MG PO TABS: 10.0000 mg | ORAL_TABLET | Freq: Two times a day (BID) | ORAL | 1 refills | Status: DC

## 2021-08-22 MED ORDER — LISINOPRIL 5 MG PO TABS: ORAL_TABLET | ORAL | 1 refills | Status: DC

## 2021-08-22 MED ORDER — IBUPROFEN 800 MG PO TABS: 800.0000 mg | ORAL_TABLET | Freq: Three times a day (TID) | ORAL | 1 refills | Status: DC | PRN

## 2021-08-22 NOTE — Patient Instructions (Signed)
Zoster Vaccine, Recombinant injection ??Qu? es [employer]? ?[employer] CONTRA EL Z?STER es una vacuna que se [place] para reducir el riesgo de contraer herpes z?ster (culebrilla). Esta vacuna no se [place] para tratar el herpes z?ster o el dolor neurol?gico causado por herpes z?ster. ?Este medicamento puede ser utilizado para otros usos; si tiene alguna pregunta consulte con su proveedor de atenci?n m?dica o con su farmac?utico. ?MARCAS COMUNES: [employer] ??Qu? le debo informar a mi profesional de la salud antes de tomar este medicamento? ?Necesitan saber si usted presenta alguno de los [employer] o situaciones: ?c?ncer ?problemas del sistema inmunol?gico ?una reacci?n al?rgica o inusual a la vacuna contra el Z?ster, a otros medicamentos, alimentos, colorantes o conservantes ?si est? embarazada o buscando quedar embarazada ?si est? amamantando a un beb? ??C?mo debo utilizar este medicamento? ?Esta vacuna se inyecta en un m?sculo. La administra un proveedor de atenci?n m?dica. ?Recibir? una copia de informaci?n escrita sobre la vacuna antes de cada vacuna. Aseg?rese de leer esta informaci?n cada vez cuidadosamente. Esta hoja puede cambiar frecuentemente. ?Hable con su proveedor de atenci?n m?dica sobre el uso de esta vacuna en ni?os. Esta vacuna no est? aprobado para uso en ni?os. ?Sobredosis: P?ngase en contacto inmediatamente con un centro toxicol?gico o una sala de urgencia si usted cree que haya tomado demasiado medicamento. ?ATENCI?N: [employer] es solo para usted. No comparta este medicamento con nadie. ??Qu? sucede si me olvido de una dosis? ?Cumpla con las citas para dosis de seguimiento (refuerzo). Es importante no olvidar ninguna dosis. Llame a su proveedor de atenci?n m?dica si no puede asistir a una cita. ??Qu? puede interactuar con este medicamento? ?medicamentos que suprimen el sistema inmunol?gico ?medicamentos para tratar el c?ncer ?medicamentos esteroideos, tales como la prednisona o la  cortisona ?Puede ser que esta lista no menciona todas las posibles interacciones. Informe a su profesional de [address] de [employer] productos a base de hierbas, medicamentos de venta libre o suplementos nutritivos que est? tomando. Si usted fuma, consume bebidas alcoh?licas o si utiliza drogas ilegales, ind?queselo tambi?n a su profesional de [employer]. Algunas sustancias pueden interactuar con su medicamento. ??A qu? debo estar atento al [employer]? ?Visite peri?dicamente a su proveedor de atenci?n m?dica. ?Es posible que esta vacuna, como todas las vacunas, no protejan completamente a todos. ??Qu? efectos secundarios puedo tener al [employer] medicamento? ?Efectos secundarios que debe informar a su m?dico o a su profesional de la salud tan pronto como sea posible: ?reacciones al?rgicas (erupci?n cut?nea, comez?n/picaz?n o urticaria; hinchaz?n de la cara, los labios o la [place]) ?problemas para respirar ?Efectos secundarios que generalmente no requieren atenci?n m?dica (debe informarlos a su m?dico o a su profesional de la salud si persisten o si son molestos): ?escalofr?os ?dolor de cabeza ?fiebre ?n?useas ?dolor, enrojecimiento o irritaci?n en el lugar de la inyecci?n ?cansancio ?v?mito ?Puede ser que esta lista no menciona todos los posibles efectos secundarios. Comun?quese a su m?dico por asesoramiento m?dico [employer] secundarios. Usted puede informar los efectos secundarios a la FDA por tel?fono al 1-800-FDA-1088. ??D?nde debo guardar mi medicina? ?Esta vacuna solamente es administrada por un proveedor de atenci?n m?dica. No se guardar? en su casa. ?ATENCI?N: Este folleto es un resumen. Puede ser que no cubra toda la posible informaci?n. Si usted tiene preguntas acerca de esta medicina, consulte con su m?dico, su farmac?utico o su profesional de Radiographer, therapeutic. ?? 2022 Elsevier/Gold Standard (2019-11-22 00:00:00) ? ?

## 2021-08-22 NOTE — Progress Notes (Signed)
?[facility] ? ?Christine Pineda, is a 56 y.o. female ? ?SWN:[id number] ? ?[id number] ? ?DOB - Feb 05, 1966 ? ?Chief Complaint  ?Patient presents with  ? Diabetes  ? Medication Refill  ?    ? ?Subjective:  ?Christine Pineda ?Christine Pineda is a 56 y.o. female here today for a follow up visit. Patient has No headache, No chest pain, No abdominal pain - No Nausea, No new weakness tingling or numbness, No Cough - SOB. ?presents forFollow-up of diabetes. Patient does not check blood sugar at home ? ?Compliant with meds - Yes ?Checking CBGs? No ?   ?Exercising regularly? - Yes ?Watching carbohydrate intake? - Yes ?Neuropathy ? - No ?Hypoglycemic events - No ? ?Pertinent ROS:  ?Polyuria - No ?Polydipsia - No ?Vision problems - No ? ?No problems updated. ? ?No Known Allergies ? ?Past Medical History:  ?Diagnosis Date  ? Diabetes mellitus without complication (HCC) 2008  ? ? ?Current Outpatient Medications on File Prior to Visit  ?Medication Sig Dispense Refill  ? atorvastatin (LIPITOR) 10 MG tablet Take 1 tablet (10 mg total) by mouth daily. 90 tablet 1  ? ferrous sulfate 325 (65 FE) MG tablet Take 1 tablet (325 mg total) by mouth daily with breakfast. 90 tablet 3  ? ?No current facility-administered medications on file prior to visit.  ? ? ?Objective:  ? ?Vitals:  ? 08/22/21 0841  ?BP: 125/86  ?Pulse: 75  ?Temp: 97.9 ?F (36.6 ?C)  ?TempSrc: Oral  ?SpO2: 100%  ?Weight: 215 lb 3.2 oz (97.6 kg)  ?Height: 5' 5.5" (1.664 m)  ? ? ?Exam ?General appearance : Awake, alert, not in any distress. Speech Clear. Not toxic looking ?HEENT: Atraumatic and Normocephalic, pupils equally reactive to light and accomodation ?Neck: Supple, no JVD. No cervical lymphadenopathy.  ?Chest: Good air entry bilaterally, no added sounds  ?CVS: S1 S2 regular, no murmurs.  ?Abdomen: Bowel sounds present, Non tender and not distended with no gaurding, rigidity or rebound. ?Extremities: B/L Lower Ext shows no edema, both legs are  warm to touch ?Neurology: Awake alert, and oriented X 3, Non focal ?Skin: No Rash ? ?Data Review ?Lab Results  ?Component Value Date  ? HGBA1C 7.1 (A) 08/22/2021  ? HGBA1C 6.3 (A) 02/22/2021  ? HGBA1C 7.8 (H) 11/19/2020  ? ? ?Assessment & Plan  ? ?1. Type 2 diabetes mellitus without complication, without long-term current use of insulin (HCC) ? Your A1C is a measure of your sugar over the past 3 months and is not affected by what you have eaten over the past few days. Diabetes increases your chances of stroke and heart attack over 300 % and is the leading cause of blindness and kidney failure in the [place]. Please make sure you decrease bad carbs like white bread, white rice, potatoes, corn, soft drinks, pasta, cereals, refined sugars, sweet tea, dried fruits, and fruit juice. Good carbs are okay to eat in moderation like sweet potatoes, brown rice, whole grain pasta/bread, most fruit (except dried fruit) and you can eat as many veggies as you want.  ? ?Greater than 6.5 is considered diabetic. ?Between 6.4 and 5.7 is prediabetic ?If your A1C is less than 5.7 you are NOT diabetic. ? ?Targets for Glucose Readings: ?Time of Check Target for patients WITHOUT Diabetes Target for DIABETICS  ?Before Meals Less than 100  less than 150  ?Two hours after meals Less than 200  Less than 250  ?  ? ?2. Comprehensive diabetic foot examination, type 2 DM,  encounter for [facility]) ?.completed ? ?3. Mixed hyperlipidemia ? Healthy lifestyle diet of fruits vegetables fish nuts whole grains and low saturated fat . Foods high in cholesterol or liver, fatty meats,cheese, butter avocados, nuts and seeds, chocolate and fried foods. ?Prescribed atorvastatin 10  ?  ? ?4. Anemia, unspecified type ?CBC ? ?5. Essential hypertension ?Blood pressure is at goal of less than 130/80, low-sodium, DASH diet, medication compliance, 150 minutes of moderate intensity exercise per week. ?Discussed medication compliance, adverse effects.  ? ? ? ?Patient  have been counseled extensively about nutrition and exercise. Other issues discussed during this visit include: low cholesterol diet, weight control and daily exercise, foot care, annual eye examinations at Ophthalmology, importance of adherence with medications and regular follow-up. We also discussed long term complications of uncontrolled diabetes and hypertension.  ? ?Return in about 3 months (around 11/22/2021) for DM. ? ?The patient was given clear instructions to go to ER or return to medical center if symptoms don't improve, worsen or new problems develop. The patient verbalized understanding. The patient was told to call to get lab results if they haven't heard anything in the next week.  ? ?This note has been created with Education officer, environmental. Any transcriptional errors are unintentional.  ? ?Christine Sessions, NP ?08/25/2021, 11:15 PM ? ?

## 2021-08-23 LAB — CBC WITH DIFFERENTIAL/PLATELET
Basophils Absolute: 0 10*3/uL (ref 0.0–0.2)
Basos: 1 %
EOS (ABSOLUTE): 0.1 10*3/uL (ref 0.0–0.4)
Eos: 1 %
Hematocrit: 38.4 % (ref 34.0–46.6)
Hemoglobin: 12.4 g/dL (ref 11.1–15.9)
Immature Grans (Abs): 0 10*3/uL (ref 0.0–0.1)
Immature Granulocytes: 0 %
Lymphocytes Absolute: 1.8 10*3/uL (ref 0.7–3.1)
Lymphs: 23 %
MCH: 30.8 pg (ref 26.6–33.0)
MCHC: 32.3 g/dL (ref 31.5–35.7)
MCV: 96 fL (ref 79–97)
Monocytes Absolute: 0.4 10*3/uL (ref 0.1–0.9)
Monocytes: 5 %
Neutrophils Absolute: 5.6 10*3/uL (ref 1.4–7.0)
Neutrophils: 70 %
Platelets: 316 10*3/uL (ref 150–450)
RBC: 4.02 x10E6/uL (ref 3.77–5.28)
RDW: 12.7 % (ref 11.7–15.4)
WBC: 7.9 10*3/uL (ref 3.4–10.8)

## 2021-08-23 LAB — COMPREHENSIVE METABOLIC PANEL
ALT: 14 IU/L (ref 0–32)
AST: 16 IU/L (ref 0–40)
Albumin/Globulin Ratio: 1.8 (ref 1.2–2.2)
Albumin: 5.6 g/dL — ABNORMAL HIGH (ref 3.8–4.9)
Alkaline Phosphatase: 95 IU/L (ref 44–121)
BUN/Creatinine Ratio: 14 (ref 9–23)
BUN: 10 mg/dL (ref 6–24)
Bilirubin Total: 0.3 mg/dL (ref 0.0–1.2)
CO2: 24 mmol/L (ref 20–29)
Calcium: 10.4 mg/dL — ABNORMAL HIGH (ref 8.7–10.2)
Chloride: 96 mmol/L (ref 96–106)
Creatinine, Ser: 0.7 mg/dL (ref 0.57–1.00)
Globulin, Total: 3.1 g/dL (ref 1.5–4.5)
Glucose: 188 mg/dL — ABNORMAL HIGH (ref 70–99)
Potassium: 4.4 mmol/L (ref 3.5–5.2)
Sodium: 140 mmol/L (ref 134–144)
Total Protein: 8.7 g/dL — ABNORMAL HIGH (ref 6.0–8.5)
eGFR: 103 mL/min/{1.73_m2} (ref >59)

## 2021-08-23 LAB — LIPID PANEL
Chol/HDL Ratio: 2.9 ratio (ref 0.0–4.4)
Cholesterol, Total: 158 mg/dL (ref 100–199)
HDL: 55 mg/dL (ref >39)
LDL Chol Calc (NIH): 81 mg/dL (ref 0–99)
Triglycerides: 126 mg/dL (ref 0–149)
VLDL Cholesterol Cal: 22 mg/dL (ref 5–40)

## 2021-08-28 ENCOUNTER — Telehealth (INDEPENDENT_AMBULATORY_CARE_PROVIDER_SITE_OTHER): Payer: Self-pay

## 2021-08-28 NOTE — Telephone Encounter (Signed)
-----   Message from Grayce Sessions, NP sent at 08/28/2021 10:37 AM EDT ----- ?Labs are  normal. Try to drink at least 48 oz of water per day. Work on eating a low fat, heart healthy diet and participate in regular aerobic exercise program to control as well. Exercise at least  30 minutes per day-5 days per week. Monitor eating red meat, fried foods,  junk foods, sodas, sugary foods or drinks, unhealthy snacking, alcohol or smoking.    ?  ?

## 2021-08-28 NOTE — Telephone Encounter (Signed)
Call placed to patient with the assistance of [place] interpreter [phone]). Patient is aware of normal lab results. Nat Christen, CMA  ?

## 2021-11-05 ENCOUNTER — Other Ambulatory Visit (INDEPENDENT_AMBULATORY_CARE_PROVIDER_SITE_OTHER): Payer: Self-pay | Admitting: Primary Care

## 2021-11-05 DIAGNOSIS — D649 Anemia, unspecified: Secondary | ICD-10-CM

## 2021-11-05 NOTE — Telephone Encounter (Signed)
Routed to PCP 

## 2021-11-21 ENCOUNTER — Other Ambulatory Visit (INDEPENDENT_AMBULATORY_CARE_PROVIDER_SITE_OTHER): Payer: Self-pay | Admitting: Primary Care

## 2021-11-21 ENCOUNTER — Encounter (INDEPENDENT_AMBULATORY_CARE_PROVIDER_SITE_OTHER): Payer: Self-pay | Admitting: Primary Care

## 2021-11-21 ENCOUNTER — Ambulatory Visit (INDEPENDENT_AMBULATORY_CARE_PROVIDER_SITE_OTHER): Payer: Self-pay | Admitting: Primary Care

## 2021-11-21 VITALS — BP 112/75 | HR 75 | Temp 98.2°F | Ht 65.5 in | Wt 219.2 lb

## 2021-11-21 DIAGNOSIS — E119 Type 2 diabetes mellitus without complications: Secondary | ICD-10-CM

## 2021-11-21 DIAGNOSIS — L918 Other hypertrophic disorders of the skin: Secondary | ICD-10-CM

## 2021-11-21 DIAGNOSIS — I10 Essential (primary) hypertension: Secondary | ICD-10-CM

## 2021-11-21 DIAGNOSIS — Z23 Encounter for immunization: Secondary | ICD-10-CM

## 2021-11-21 DIAGNOSIS — E782 Mixed hyperlipidemia: Secondary | ICD-10-CM

## 2021-11-21 LAB — POCT GLYCOSYLATED HEMOGLOBIN (HGB A1C): Hemoglobin A1C: 7.1 % — AB (ref 4.0–5.6)

## 2021-11-21 MED ORDER — GLIPIZIDE 10 MG PO TABS: 10.0000 mg | ORAL_TABLET | Freq: Two times a day (BID) | ORAL | 1 refills | Status: DC

## 2021-11-21 MED ORDER — METFORMIN HCL 1000 MG PO TABS: ORAL_TABLET | ORAL | 1 refills | Status: DC

## 2021-11-21 MED ORDER — ATORVASTATIN CALCIUM 10 MG PO TABS: ORAL_TABLET | ORAL | 1 refills | Status: DC

## 2021-11-21 MED ORDER — LISINOPRIL 5 MG PO TABS
ORAL_TABLET | ORAL | 1 refills | Status: DC
Start: 2021-11-21 — End: 2022-07-08

## 2021-11-21 NOTE — Patient Instructions (Signed)
  Papiloma cutneo en los adultos Skin Tag, Adult  Un papiloma cutneo (acrocordn) es un crecimiento extra de piel suave. La mayora de los papilomas cutneos tienen el mismo color de la piel y no son ms grandes que la goma de un lpiz. Generalmente se forman en zonas donde hay roce o friccin frecuente sobre la piel. Estos lugares pueden ser donde hay pliegues en la piel, como los prpados, el cuello, las axilas o la ingle. Los papilomas cutneos no son peligrosos y no se transmiten de una persona a otra (no son contagiosos). Puede tener un solo papiloma cutneo o varios. Los papilomas cutneos no requieren tratamiento. Sin embargo, el mdico puede recomendarle que se lo quite si: Se irrita con el roce de la ropa o de joyas. Sangra. Se ve y tiene un aspecto antiesttico. Cules son las causas? Esta afeccin se relaciona con lo siguiente: Edad avanzada. Embarazo. Diabetes. Obesidad. Cules son los signos o sntomas? Habitualmente, los papilomas cutneos no causan sntomas, a menos que se irriten por objetos que toquen la piel, como ropa o joyas. Cuando esto sucede, usted puede tener dolor, picazn o sangrado. Cmo se diagnostica? Esta afeccin se diagnostica a travs de una evaluacin que realiza el mdico. No se necesita ningn estudio para el diagnstico. Cmo se trata? El tratamiento de esta afeccin depende de la presencia de sntomas. Si es necesario retirar un papiloma cutneo, el mdico puede extirparlo con: Un procedimiento quirrgico simple con tijeras. Un procedimiento en el que se congela el papiloma cutneo con un gas en forma de lquido (nitrgeno lquido). Un procedimiento que utiliza calor para destruir el papiloma cutneo (electrodesecacin). El mdico tambin puede retirarle el papiloma cutneo si es visible o antiesttico. Siga estas instrucciones en su casa: Controle el papiloma para detectar cualquier cambio. Un papiloma cutneo normal no requiere ningn otro  cuidado especial en el hogar. Use los medicamentos de venta libre y los recetados solamente como se lo haya indicado el mdico. Concurra a todas las visitas de seguimiento como se lo haya indicado el mdico. Esto es importante. Comunquese con un mdico si: Tiene un papiloma cutneo que: Duele. Cambia de color. Sangra. Se hincha. Resumen Los papilomas cutneos son crecimientos extra de piel suave que se encuentran en reas de roce o friccin frecuente. Generalmente, los papilomas cutneos no causan sntomas. Si aparecen sntomas, puede tener dolor, picazn o sangrado. Si el papiloma cutneo causa sntomas o es antiesttico, el mdico puede retirarlo. Esta informacin no tiene como fin reemplazar el consejo del mdico. Asegrese de hacerle al mdico cualquier pregunta que tenga. Document Revised: 06/01/2019 Document Reviewed: 06/01/2019 Elsevier Patient Education  2022 Elsevier Inc.  

## 2021-11-21 NOTE — Progress Notes (Unsigned)
Subjective:  Patient ID: Christine Pineda, female    DOB: 07-Jul-1965  Age: 56 y.o. MRN: [id number]  CC: Diabetes   HPI [name] is a 56 year old Hispanic female. ( Interpreter Shanda Bumps [phone])  presents for follow-up of diabetes. Patient does not check blood sugar at home  Compliant with meds - Yes Checking CBGs? No  Fasting avg -   Postprandial average -  Exercising regularly? - Yes Watching carbohydrate intake? - Yes Neuropathy ? - No Hypoglycemic events - No  - Recovers with :   Pertinent ROS:  Polyuria - No Polydipsia - No Vision problems - No  Medications as noted below. Taking them regularly without complication/adverse reaction being reported today.   History Christine Pineda has a past medical history of Diabetes mellitus without complication (HCC) (2008).   She has a past surgical history that includes Tubal ligation (Bilateral, 06/02/2006) and Cesarean section.   Her family history includes Diabetes in her father.She reports that she has never smoked. She has never used smokeless tobacco. She reports that she does not drink alcohol and does not use drugs.  Current Outpatient Medications on File Prior to Visit  Medication Sig Dispense Refill   atorvastatin (LIPITOR) 10 MG tablet Take 1 tablet (10 mg total) by mouth daily. 90 tablet 1   ferrous sulfate 325 (65 FE) MG tablet Take 1 tablet by mouth once daily with breakfast 90 tablet 0   glipiZIDE (GLUCOTROL) 10 MG tablet Take 1 tablet (10 mg total) by mouth 2 (two) times daily before a meal. 180 tablet 1   ibuprofen (ADVIL) 800 MG tablet Take 1 tablet (800 mg total) by mouth every 8 (eight) hours as needed. 90 tablet 1   lisinopril (ZESTRIL) 5 MG tablet TAKE 1 TABLET BY MOUTH ONCE DAILY . KEEP APPOINTMENT 11/19/2020 FOR FUTURE REFILLS 90 tablet 1   metFORMIN (GLUCOPHAGE) 1000 MG tablet Tome un comprimido despu s del desayuno y el segundo comprimido despu s de la cena. 180 tablet 1   No current  facility-administered medications on file prior to visit.    ROS Comprehensive ROS Pertinent positive and negative noted in HPI    Objective:  BP 112/75   Pulse 75   Temp 98.2 F (36.8 C) (Oral)   Ht 5' 5.5" (1.664 m)   Wt 219 lb 3.2 oz (99.4 kg)   LMP 08/14/2020 (Exact Date) Comment: last 3-4 days light - spotting recently not a regular spotting  SpO2 97%   BMI 35.92 kg/m   BP Readings from Last 3 Encounters:  11/21/21 112/75  08/22/21 125/86  02/22/21 116/73    Wt Readings from Last 3 Encounters:  11/21/21 219 lb 3.2 oz (99.4 kg)  08/22/21 215 lb 3.2 oz (97.6 kg)  02/22/21 211 lb 6.4 oz (95.9 kg)    Physical Exam Vitals reviewed.  Constitutional:      Appearance: She is obese.  HENT:     Head: Normocephalic.     Right Ear: Tympanic membrane and external ear normal.     Left Ear: Tympanic membrane and external ear normal.     Nose: Nose normal.  Eyes:     Extraocular Movements: Extraocular movements intact.     Pupils: Pupils are equal, round, and reactive to light.  Cardiovascular:     Rate and Rhythm: Normal rate and regular rhythm.  Pulmonary:     Effort: Pulmonary effort is normal.     Breath sounds: Normal breath sounds.  Abdominal:     General: Bowel  sounds are normal. There is distension.     Palpations: Abdomen is soft.  Musculoskeletal:        General: Normal range of motion.     Cervical back: Normal range of motion and neck supple.  Skin:    General: Skin is warm and dry.  Neurological:     Mental Status: She is alert and oriented to person, place, and time.  Psychiatric:        Mood and Affect: Mood normal.        Behavior: Behavior normal.        Thought Content: Thought content normal.        Judgment: Judgment normal.   Lab Results  Component Value Date   HGBA1C 7.1 (A) 11/21/2021   HGBA1C 7.1 (A) 08/22/2021   HGBA1C 6.3 (A) 02/22/2021    Lab Results  Component Value Date   WBC 7.9 08/22/2021   HGB 12.4 08/22/2021   HCT 38.4  08/22/2021   PLT 316 08/22/2021   GLUCOSE 188 (H) 08/22/2021   CHOL 158 08/22/2021   TRIG 126 08/22/2021   HDL 55 08/22/2021   LDLCALC 81 08/22/2021   ALT 14 08/22/2021   AST 16 08/22/2021   NA 140 08/22/2021   K 4.4 08/22/2021   CL 96 08/22/2021   CREATININE 0.70 08/22/2021   BUN 10 08/22/2021   CO2 24 08/22/2021   TSH 1.750 03/11/2018   HGBA1C 7.1 (A) 11/21/2021   MICROALBUR <0.2 08/27/2015     Assessment & Plan:  Yesli was seen today for diabetes.  Diagnoses and all orders for this visit:  Type 2 diabetes mellitus without complication, without long-term current use of insulin (HCC) -     HgB A1c -     metFORMIN (GLUCOPHAGE) 1000 MG tablet; Tome un comprimido despu s del desayuno y el segundo comprimido despu s de la cena.  Need for shingles vaccine -     Varicella-zoster vaccine IM (Shingrix)  Essential hypertension -     lisinopril (ZESTRIL) 5 MG tablet; TAKE 1 TABLET BY MOUTH ONCE DAILY . KEEP APPOINTMENT 11/19/2020 FOR FUTURE REFILLS  Mixed hyperlipidemia -     atorvastatin (LIPITOR) 10 MG tablet; Take 1 tablet every other day  Acrochordon Irritation/pain  Referral to general surgery   Other orders -     glipiZIDE (GLUCOTROL) 10 MG tablet; Take 1 tablet (10 mg total) by mouth 2 (two) times daily before a meal.     I am having Christine Pineda maintain her atorvastatin, metFORMIN, lisinopril, ibuprofen, glipiZIDE, and ferrous sulfate.  No orders of the defined types were placed in this encounter.    Follow-up:   No follow-ups on file.  The above assessment and management plan was discussed with the patient. The patient verbalized understanding of and has agreed to the management plan. Patient is aware to call the clinic if symptoms fail to improve or worsen. Patient is aware when to return to the clinic for a follow-up visit. Patient educated on when it is appropriate to go to the emergency department.   Gwinda Passe, NP-C

## 2022-01-27 ENCOUNTER — Other Ambulatory Visit (INDEPENDENT_AMBULATORY_CARE_PROVIDER_SITE_OTHER): Payer: Self-pay | Admitting: Primary Care

## 2022-01-27 DIAGNOSIS — D649 Anemia, unspecified: Secondary | ICD-10-CM

## 2022-01-27 NOTE — Telephone Encounter (Signed)
Routed to PCP 

## 2022-01-28 ENCOUNTER — Other Ambulatory Visit (INDEPENDENT_AMBULATORY_CARE_PROVIDER_SITE_OTHER): Payer: Self-pay | Admitting: Primary Care

## 2022-01-28 DIAGNOSIS — D649 Anemia, unspecified: Secondary | ICD-10-CM

## 2022-02-20 ENCOUNTER — Other Ambulatory Visit: Payer: Self-pay | Admitting: Obstetrics and Gynecology

## 2022-02-20 DIAGNOSIS — Z1231 Encounter for screening mammogram for malignant neoplasm of breast: Secondary | ICD-10-CM

## 2022-02-25 ENCOUNTER — Encounter (INDEPENDENT_AMBULATORY_CARE_PROVIDER_SITE_OTHER): Payer: Self-pay | Admitting: Primary Care

## 2022-02-25 ENCOUNTER — Ambulatory Visit (INDEPENDENT_AMBULATORY_CARE_PROVIDER_SITE_OTHER): Payer: Self-pay | Admitting: Primary Care

## 2022-02-25 VITALS — BP 130/80 | HR 73 | Resp 16 | Wt 217.0 lb

## 2022-02-25 DIAGNOSIS — Z23 Encounter for immunization: Secondary | ICD-10-CM

## 2022-02-25 DIAGNOSIS — E119 Type 2 diabetes mellitus without complications: Secondary | ICD-10-CM

## 2022-02-25 LAB — POCT GLYCOSYLATED HEMOGLOBIN (HGB A1C): HbA1c, POC (controlled diabetic range): 6.4 % (ref 0.0–7.0)

## 2022-02-25 LAB — GLUCOSE, POCT (MANUAL RESULT ENTRY): POC Glucose: 127 mg/dl — AB (ref 70–99)

## 2022-02-25 NOTE — Progress Notes (Signed)
Subjective:  Patient ID: Christine Pineda, female    DOB: 23-Sep-1965  Age: 56 y.o. MRN: [id number]  CC: Diabetes   HPI Christine Pineda ( interpreter Quillian Quince [phone]) presents forFollow-up of diabetes. Patient does not check blood sugar at home. She has bilateral knee pain and relieved with Ibuprofen prn. Patient has No headache, No chest pain, No abdominal pain - No Nausea, No new weakness tingling or numbness, No Cough - shortness of breath   Compliant with meds - Yes Checking CBGs? No  Fasting avg -   Postprandial average -  Exercising regularly? - Yes Watching carbohydrate intake? - Yes Neuropathy ? - No Hypoglycemic events - No  - Recovers with :   Pertinent ROS:  Polyuria - No Polydipsia - No Vision problems - No  Medications as noted below. Taking them regularly without complication/adverse reaction being reported today.   History Christine Pineda has a past medical history of Diabetes mellitus without complication ([place]) (2130).   She has a past surgical history that includes Tubal ligation (Bilateral, 06/02/2006) and Cesarean section.   Her family history includes Diabetes in her father.She reports that she has never smoked. She has never used smokeless tobacco. She reports that she does not drink alcohol and does not use drugs.  Current Outpatient Medications on File Prior to Visit  Medication Sig Dispense Refill   atorvastatin (LIPITOR) 10 MG tablet Take 1 tablet every other day 90 tablet 1   ferrous sulfate 325 (65 FE) MG tablet Take 1 tablet by mouth once daily with breakfast 90 tablet 0   ibuprofen (ADVIL) 800 MG tablet Take 1 tablet (800 mg total) by mouth every 8 (eight) hours as needed. 90 tablet 1   lisinopril (ZESTRIL) 5 MG tablet TAKE 1 TABLET BY MOUTH ONCE DAILY . KEEP APPOINTMENT 11/19/2020 FOR FUTURE REFILLS 90 tablet 1   metFORMIN (GLUCOPHAGE) 1000 MG tablet Tome un comprimido despu s del desayuno y el segundo comprimido despu s de la cena. 180 tablet 1    No current facility-administered medications on file prior to visit.    ROS Comprehensive ROS Pertinent positive and negative noted in HPI    Objective:  BP 130/80   Pulse 73   Resp 16   Wt 217 lb (98.4 kg)   LMP 08/14/2020 (Exact Date) Comment: last 3-4 days light - spotting recently not a regular spotting  SpO2 99%   BMI 35.56 kg/m  BP 130/80   Pulse 73   Resp 16   Wt 217 lb (98.4 kg)   LMP 08/14/2020 (Exact Date) Comment: last 3-4 days light - spotting recently not a regular spotting  SpO2 99%   BMI 35.56 kg/m    BP Readings from Last 3 Encounters:  02/25/22 130/80  11/21/21 112/75  08/22/21 125/86    Wt Readings from Last 3 Encounters:  02/25/22 217 lb (98.4 kg)  11/21/21 219 lb 3.2 oz (99.4 kg)  08/22/21 215 lb 3.2 oz (97.6 kg)   Physical exam: General: Vital signs reviewed.  Patient is well-developed and well-nourished, obese in no acute distress and cooperative with exam. Head: Normocephalic and atraumatic. Eyes: EOMI, conjunctivae normal, no scleral icterus. Neck: Supple, trachea midline, normal ROM, no JVD, masses, thyromegaly, or carotid bruit present. Cardiovascular: RRR, S1 normal, S2 normal, no murmurs, gallops, or rubs. Pulmonary/Chest: Clear to auscultation bilaterally, no wheezes, rales, or rhonchi. Abdominal: Soft, non-tender, non-distended, BS +, no masses, organomegaly, or guarding present. Musculoskeletal: No joint deformities, erythema, or stiffness, ROM full and nontender.  Extremities: No lower extremity edema bilaterally,  pulses symmetric and intact bilaterally. No cyanosis or clubbing. Neurological: A&O x3, Strength is normal Skin: Warm, dry and intact. No rashes or erythema. Psychiatric: Normal mood and affect. speech and behavior is normal. Cognition and memory are normal.     Lab Results  Component Value Date   HGBA1C 6.4 02/25/2022   HGBA1C 7.1 (A) 11/21/2021   HGBA1C 7.1 (A) 08/22/2021    Lab Results  Component Value Date    WBC 7.9 08/22/2021   HGB 12.4 08/22/2021   HCT 38.4 08/22/2021   PLT 316 08/22/2021   GLUCOSE 188 (H) 08/22/2021   CHOL 158 08/22/2021   TRIG 126 08/22/2021   HDL 55 08/22/2021   LDLCALC 81 08/22/2021   ALT 14 08/22/2021   AST 16 08/22/2021   NA 140 08/22/2021   K 4.4 08/22/2021   CL 96 08/22/2021   CREATININE 0.70 08/22/2021   BUN 10 08/22/2021   CO2 24 08/22/2021   TSH 1.750 03/11/2018   HGBA1C 6.4 02/25/2022   MICROALBUR <0.2 08/27/2015     Assessment & Plan:   Christine Pineda was seen today for diabetes.  Diagnoses and all orders for this visit:  Type 2 diabetes mellitus without complication, without long-term current use of insulin (HCC) -     POCT glucose (manual entry) -     POCT glycosylated hemoglobin (Hb A1C) 6.4  -     Microalbumin / creatinine urine ratio D/c glipizide 682m BID continue metformin 10012mbid with food.  ADA recommends the following therapeutic goals for glycemic control related to A1c measurements: Goal of therapy: Less than 6.5 hemoglobin A1c.  Goal is met Reference clinical practice recommendations. Foods that are high in carbohydrates are the following rice, potatoes, breads, sugars, and pastas.  Reduction in the intake (eating) will assist in lowering your blood sugars.     POCT glucose (manual entry) -     POCT glycosylated hemoglobin (Hb A1C) -     Microalbumin / creatinine urine ratio -     Lipid panel -     CMP14+EGFR -   Need for immunization against influenza -     Flu Vaccine QUAD 82m64mo (Fluarix, Fluzone & Alfiuria Quad PF)    I have discontinued Christine Pineda's glipiZIDE. I am also having her maintain her ibuprofen, ferrous sulfate, metFORMIN, lisinopril, and atorvastatin.  No orders of the defined types were placed in this encounter.    Follow-up:   Return in about 3 months (around 05/27/2022) for DM.  The above assessment and management plan was discussed with the patient. The patient verbalized understanding of and  has agreed to the management plan. Patient is aware to call the clinic if symptoms fail to improve or worsen. Patient is aware when to return to the clinic for a follow-up visit. Patient educated on when it is appropriate to go to the emergency department.   MicJuluis MireP-C

## 2022-02-26 LAB — MICROALBUMIN / CREATININE URINE RATIO
Creatinine, Urine: 43.9 mg/dL
Microalb/Creat Ratio: 19 mg/g creat (ref 0–29)
Microalbumin, Urine: 8.4 ug/mL

## 2022-02-26 LAB — LIPID PANEL
Chol/HDL Ratio: 2.8 ratio (ref 0.0–4.4)
Cholesterol, Total: 146 mg/dL (ref 100–199)
HDL: 52 mg/dL (ref >39)
LDL Chol Calc (NIH): 77 mg/dL (ref 0–99)
Triglycerides: 92 mg/dL (ref 0–149)
VLDL Cholesterol Cal: 17 mg/dL (ref 5–40)

## 2022-02-26 LAB — CMP14+EGFR
ALT: 12 IU/L (ref 0–32)
AST: 11 IU/L (ref 0–40)
Albumin/Globulin Ratio: 2 (ref 1.2–2.2)
Albumin: 4.9 g/dL (ref 3.8–4.9)
Alkaline Phosphatase: 64 IU/L (ref 44–121)
BUN/Creatinine Ratio: 14 (ref 9–23)
BUN: 9 mg/dL (ref 6–24)
Bilirubin Total: 0.3 mg/dL (ref 0.0–1.2)
CO2: 25 mmol/L (ref 20–29)
Calcium: 9.6 mg/dL (ref 8.7–10.2)
Chloride: 101 mmol/L (ref 96–106)
Creatinine, Ser: 0.66 mg/dL (ref 0.57–1.00)
Globulin, Total: 2.5 g/dL (ref 1.5–4.5)
Glucose: 123 mg/dL — ABNORMAL HIGH (ref 70–99)
Potassium: 4.3 mmol/L (ref 3.5–5.2)
Sodium: 141 mmol/L (ref 134–144)
Total Protein: 7.4 g/dL (ref 6.0–8.5)
eGFR: 104 mL/min/{1.73_m2} (ref >59)

## 2022-04-08 ENCOUNTER — Ambulatory Visit: Payer: Self-pay | Admitting: *Deleted

## 2022-04-08 ENCOUNTER — Ambulatory Visit
Admission: RE | Admit: 2022-04-08 | Discharge: 2022-04-08 | Disposition: A | Discharge status: Home / Self Care | Payer: No Typology Code available for payment source | Source: Ambulatory Visit | Attending: Obstetrics and Gynecology | Admitting: Obstetrics and Gynecology

## 2022-04-08 VITALS — BP 132/82 | Wt 213.0 lb

## 2022-04-08 DIAGNOSIS — Z1211 Encounter for screening for malignant neoplasm of colon: Secondary | ICD-10-CM

## 2022-04-08 DIAGNOSIS — Z1231 Encounter for screening mammogram for malignant neoplasm of breast: Secondary | ICD-10-CM

## 2022-04-08 DIAGNOSIS — Z1239 Encounter for other screening for malignant neoplasm of breast: Secondary | ICD-10-CM

## 2022-04-08 NOTE — Progress Notes (Signed)
Ms. Christine Pineda is a 56 y.o. female who presents to [facility] clinic today with no complaints.    Pap Smear: Pap smear not completed today. Last Pap smear was 05/01/2021 at the free cervical cancer screening clinic and was normal with negative HPV. Per patient has no history of an abnormal Pap smear. Last Pap smear result is available in Epic.   Physical exam: Breasts Breasts symmetrical. No skin abnormalities bilateral breasts. No nipple retraction bilateral breasts. No nipple discharge bilateral breasts. No lymphadenopathy. No lumps palpated bilateral breasts. No complaints of pain or tenderness on exam.  MM 3D SCREEN BREAST BILATERAL  Result Date: 03/31/2021 CLINICAL DATA:  Screening. EXAM: DIGITAL SCREENING BILATERAL MAMMOGRAM WITH TOMOSYNTHESIS AND CAD TECHNIQUE: Bilateral screening digital craniocaudal and mediolateral oblique mammograms were obtained. Bilateral screening digital breast tomosynthesis was performed. The images were evaluated with computer-aided detection. COMPARISON:  Previous exam(s). ACR Breast Density Category c: The breast tissue is heterogeneously dense, which may obscure small masses. FINDINGS: There are no findings suspicious for malignancy. IMPRESSION: No mammographic evidence of malignancy. A result letter of this screening mammogram will be mailed directly to the patient. RECOMMENDATION: Screening mammogram in one year. (Code:SM-B-01Y) BI-RADS CATEGORY  1: Negative. Electronically Signed   By: Ammie Ferrier M.D.   On: 03/31/2021 09:37   MS DIGITAL SCREENING TOMO BILATERAL  Result Date: 05/07/2018 CLINICAL DATA:  Screening. EXAM: DIGITAL SCREENING BILATERAL MAMMOGRAM WITH TOMO AND CAD COMPARISON:  Previous exam(s). ACR Breast Density Category c: The breast tissue is heterogeneously dense, which may obscure small masses. FINDINGS: There are no findings suspicious for malignancy. Images were processed with CAD. IMPRESSION: No mammographic evidence of malignancy.  A result letter of this screening mammogram will be mailed directly to the patient. RECOMMENDATION: Screening mammogram in one year. (Code:SM-B-01Y) BI-RADS CATEGORY  1: Negative. Electronically Signed   By: Lillia Mountain M.D.   On: 05/07/2018 09:23    Pelvic/Bimanual Pap is not indicated today per BCCCP guidelines.   Smoking History: Patient has never smoked.   Patient Navigation: Patient education provided. Access to services provided for patient through [place] program. Spanish interpreter Rudene Anda from [facility] provided. Patient has food insecurities. Patient escorted to the [facility] for [place] for groceries.  Colorectal Cancer Screening: Per patient has never had colonoscopy completed. FIT Test given to patient to complete. No complaints today.    Breast and Cervical Cancer Risk Assessment: Patient does not have family history of breast cancer, known genetic mutations, or radiation treatment to the chest before age 15. Patient does not have history of cervical dysplasia, immunocompromised, or DES exposure in-utero.  Risk Assessment     Risk Scores       04/08/2022 05/06/2018   Last edited by: Loletta Parish, RN Armond Hang, LPN   5-year risk: 0.8 % 0.7 %   Lifetime risk: 5.9 % 6.4 %           A: BCCCP exam without pap smear No complaints.  P: Referred patient to the [place] for a screening mammogram on mobile unit. Appointment scheduled Tuesday, April 08, 2022 at 1130.  Loletta Parish, RN 04/08/2022 11:09 AM

## 2022-04-08 NOTE — Patient Instructions (Signed)
Explained breast self awareness with Christine Pineda. Patient did not need a Pap smear today due to last Pap smear and HPV typing was 05/01/2021. Let her know BCCCP will cover Pap smears and HPV typing every 5 years unless has a history of abnormal Pap smears. Referred patient to the [facility] for a screening mammogram on mobile unit. Appointment scheduled Tuesday, April 08, 2022 at 1130. Patient aware of appointment and will be there. Let patient know the [facility] will follow up with her within the next couple weeks with results of her mammogram by letter or phone. Christine Pineda verbalized understanding.  Terrill Alperin, Arvil Chaco, RN 11:09 AM

## 2022-04-30 ENCOUNTER — Other Ambulatory Visit (INDEPENDENT_AMBULATORY_CARE_PROVIDER_SITE_OTHER): Payer: Self-pay | Admitting: Primary Care

## 2022-04-30 NOTE — Telephone Encounter (Signed)
Requested Prescriptions  Pending Prescriptions Disp Refills   ibuprofen (ADVIL) 800 MG tablet [Pharmacy Med Name: Ibuprofen 800 MG Oral Tablet] 90 tablet 0    Sig: TAKE 1 TABLET BY MOUTH EVERY 8 HOURS AS NEEDED     Analgesics:  NSAIDS Failed - 04/30/2022  5:34 PM      Failed - Manual Review: Labs are only required if the patient has taken medication for more than 8 weeks.      Passed - Cr in normal range and within 360 days    Creat  Date Value Ref Range Status  08/03/2014 0.60 0.50 - 1.10 mg/dL Final   Creatinine, Ser  Date Value Ref Range Status  02/25/2022 0.66 0.57 - 1.00 mg/dL Final   Creatinine, Urine  Date Value Ref Range Status  08/25/2012 224.9 mg/dL Final         Passed - HGB in normal range and within 360 days    Hemoglobin  Date Value Ref Range Status  08/22/2021 12.4 11.1 - 15.9 g/dL Final         Passed - PLT in normal range and within 360 days    Platelets  Date Value Ref Range Status  08/22/2021 316 150 - 450 x10E3/uL Final         Passed - HCT in normal range and within 360 days    Hematocrit  Date Value Ref Range Status  08/22/2021 38.4 34.0 - 46.6 % Final         Passed - eGFR is 30 or above and within 360 days    GFR calc Af Amer  Date Value Ref Range Status  02/01/2020 120 >59 mL/min/1.73 Final    Comment:    **[facility] currently reports eGFR in compliance with the current**   recommendations of the [employer]. [facility] will   update reporting as new guidelines are published from the NKF-ASN   Task force.    GFR calc non Af Amer  Date Value Ref Range Status  02/01/2020 104 >59 mL/min/1.73 Final   eGFR  Date Value Ref Range Status  02/25/2022 104 >59 mL/min/1.73 Final         Passed - Patient is not pregnant      Passed - Valid encounter within last 12 months    Recent Outpatient Visits           2 months ago Type 2 diabetes mellitus without complication, without long-term current use of insulin ([place])   [place]  [facility] Juluis Mire P, NP   5 months ago Type 2 diabetes mellitus without complication, without long-term current use of insulin ([place])   [place] Juluis Mire P, NP   8 months ago Type 2 diabetes mellitus without complication, without long-term current use of insulin ([place])   [place] Juluis Mire P, NP   1 year ago Type 2 diabetes mellitus without complication, without long-term current use of insulin ([place])   [place] [facility] Juluis Mire P, NP   1 year ago Type 2 diabetes mellitus without complication, without long-term current use of insulin ([place])   [place] [facility] Kerin Perna, NP       Future Appointments             In 3 weeks Oletta Lamas, Milford Cage, NP [place]

## 2022-05-27 ENCOUNTER — Ambulatory Visit (INDEPENDENT_AMBULATORY_CARE_PROVIDER_SITE_OTHER): Payer: Self-pay | Admitting: Primary Care

## 2022-07-08 ENCOUNTER — Encounter (INDEPENDENT_AMBULATORY_CARE_PROVIDER_SITE_OTHER): Payer: Self-pay | Admitting: Primary Care

## 2022-07-08 ENCOUNTER — Ambulatory Visit (INDEPENDENT_AMBULATORY_CARE_PROVIDER_SITE_OTHER): Payer: Self-pay | Admitting: Primary Care

## 2022-07-08 VITALS — BP 120/81 | HR 75 | Resp 16 | Wt 209.0 lb

## 2022-07-08 DIAGNOSIS — Z760 Encounter for issue of repeat prescription: Secondary | ICD-10-CM

## 2022-07-08 DIAGNOSIS — E119 Type 2 diabetes mellitus without complications: Secondary | ICD-10-CM

## 2022-07-08 DIAGNOSIS — M25561 Pain in right knee: Secondary | ICD-10-CM

## 2022-07-08 DIAGNOSIS — M542 Cervicalgia: Secondary | ICD-10-CM

## 2022-07-08 DIAGNOSIS — E782 Mixed hyperlipidemia: Secondary | ICD-10-CM

## 2022-07-08 DIAGNOSIS — G8929 Other chronic pain: Secondary | ICD-10-CM

## 2022-07-08 DIAGNOSIS — I10 Essential (primary) hypertension: Secondary | ICD-10-CM

## 2022-07-08 MED ORDER — IBUPROFEN 800 MG PO TABS: 800.0000 mg | ORAL_TABLET | Freq: Three times a day (TID) | ORAL | 1 refills | Status: AC | PRN

## 2022-07-08 MED ORDER — LISINOPRIL 5 MG PO TABS: ORAL_TABLET | ORAL | 1 refills | Status: DC

## 2022-07-08 MED ORDER — ATORVASTATIN CALCIUM 10 MG PO TABS: ORAL_TABLET | ORAL | 1 refills | Status: DC

## 2022-07-08 NOTE — Progress Notes (Signed)
[place]   Ms. Christine Pineda is a 57 y.o. obese Hispanic  female ( interpreter Cristi [id number])presents for hypertension evaluation, Denies shortness of breath, headaches, chest pain or lower extremity edema, sudden onset, vision changes, unilateral weakness, dizziness, paresthesias . Management of T2D- denies polyuria, polydipsia, polyphagia, or vision changes. She does have neck pain intermittent -ROM -not stiff today- left side than position to the right makes it feel better. Relieved with ibuprofen. Probably how she sleeps wakes up hurting 10/10. Requesting medication refills.   Patient reports adherence with medications.  Dietary habits include: monitor sodium and process foods  Exercise habits include:walks  Family / Social history: DM - father    Past Medical History:  Diagnosis Date   Diabetes mellitus without complication ([place]) 4193   Past Surgical History:  Procedure Laterality Date   CESAREAN SECTION     for macrosomia   TUBAL LIGATION Bilateral 06/02/2006   Performed at [facility], however, can't find record   No Known Allergies Current Outpatient Medications on File Prior to Visit  Medication Sig Dispense Refill   atorvastatin (LIPITOR) 10 MG tablet Take 1 tablet every other day 90 tablet 1   ferrous sulfate 325 (65 FE) MG tablet Take 1 tablet by mouth once daily with breakfast (Patient not taking: Reported on 04/08/2022) 90 tablet 0   ibuprofen (ADVIL) 800 MG tablet TAKE 1 TABLET BY MOUTH EVERY 8 HOURS AS NEEDED 90 tablet 0   lisinopril (ZESTRIL) 5 MG tablet TAKE 1 TABLET BY MOUTH ONCE DAILY . KEEP APPOINTMENT 11/19/2020 FOR FUTURE REFILLS 90 tablet 1   metFORMIN (GLUCOPHAGE) 1000 MG tablet Tome un comprimido despu s del desayuno y el segundo comprimido despu s de la cena. 180 tablet 1   No current facility-administered medications on file prior to visit.   Social History   Socioeconomic History   Marital status: Single    Spouse name:  Not on file   Number of children: Not on file   Years of education: Not on file   Highest education level: Not on file  Occupational History   Not on file  Tobacco Use   Smoking status: Never   Smokeless tobacco: Never  Vaping Use   Vaping Use: Never used  Substance and Sexual Activity   Alcohol use: No   Drug use: No   Sexual activity: Yes    Birth control/protection: Surgical  Other Topics Concern   Not on file  Social History Narrative   Not on file   Social Determinants of Health   Financial Resource Strain: Not on file  Food Insecurity: Food Insecurity Present (04/08/2022)   Hunger Vital Sign    Worried About Running Out of Food in the Last Year: Sometimes true    Ran Out of Food in the Last Year: Sometimes true  Transportation Needs: No Transportation Needs (04/08/2022)   PRAPARE - Hydrologist (Medical): No    Lack of Transportation (Non-Medical): No  Physical Activity: Not on file  Stress: Not on file  Social Connections: Not on file  Intimate Partner Violence: Not on file   Family History  Problem Relation Age of Onset   Diabetes Father    Breast cancer Neg Hx      OBJECTIVE:  Vitals:   07/08/22 1056  BP: 120/81  Pulse: 75  Resp: 16  SpO2: 99%  Weight: 209 lb (94.8 kg)    Physical Exam Constitutional:      Appearance: Normal  appearance. She is obese.  HENT:     Right Ear: Tympanic membrane and external ear normal.     Left Ear: Tympanic membrane and external ear normal.     Nose: Nose normal.  Eyes:     Extraocular Movements: Extraocular movements intact.     Pupils: Pupils are equal, round, and reactive to light.  Cardiovascular:     Rate and Rhythm: Normal rate and regular rhythm.  Pulmonary:     Effort: Pulmonary effort is normal.     Breath sounds: Normal breath sounds.  Abdominal:     General: Bowel sounds are normal. There is distension.     Palpations: Abdomen is soft.  Musculoskeletal:        General:  Normal range of motion.     Cervical back: Normal range of motion and neck supple.  Skin:    General: Skin is warm and dry.  Neurological:     Mental Status: She is alert and oriented to person, place, and time.  Psychiatric:        Mood and Affect: Mood normal.        Behavior: Behavior normal.        Thought Content: Thought content normal.    ROS Comprehensive ROS Pertinent positive and negative noted in HPI   Last 3 Office BP readings: BP Readings from Last 3 Encounters:  07/08/22 120/81  04/08/22 132/82  02/25/22 130/80    BMET    Component Value Date/Time   NA 141 02/25/2022 0914   K 4.3 02/25/2022 0914   CL 101 02/25/2022 0914   CO2 25 02/25/2022 0914   GLUCOSE 123 (H) 02/25/2022 0914   GLUCOSE 155 (H) 08/03/2014 0950   BUN 9 02/25/2022 0914   CREATININE 0.66 02/25/2022 0914   CREATININE 0.60 08/03/2014 0950   CALCIUM 9.6 02/25/2022 0914   GFRNONAA 104 02/01/2020 0936   GFRAA 120 02/01/2020 0936    Renal function: CrCl cannot be calculated (Patient's most recent lab result is older than the maximum 21 days allowed.).  Clinical ASCVD: No  The 10-year ASCVD risk score (Arnett DK, et al., 2019) is: 3.4%   Values used to calculate the score:     Age: 34 years     Sex: Female     Is Non-Hispanic African American: No     Diabetic: Yes     Tobacco smoker: No     Systolic Blood Pressure: 102 mmHg     Is BP treated: Yes     HDL Cholesterol: 52 mg/dL     Total Cholesterol: 146 mg/dL  ASCVD risk factors include- [place]   ASSESSMENT & PLAN: Monserat was seen today for diabetes and hypertension.  Diagnoses and all orders for this visit:  Type 2 diabetes mellitus without complication, without long-term current use of insulin ([place]) - educated on lifestyle modifications, including but not limited to diet choices and adding exercise to daily routine.                A1C -     CBC with Differential -     Lipid Panel -     Ambulatory referral to Ophthalmology -      CMP14+EGFR -     lisinopril (ZESTRIL) 5 MG tablet; TAKE 1 TABLET BY MOUTH ONCE DAILY   Neck pain Suggest neck pillow and may take ibuprofen after breakfast   Medication refill   lisinopril (ZESTRIL) 5 MG tablet; TAKE 1 TABLET BY MOUTH ONCE DAILY .  -  ibuprofen (ADVIL) 800 MG tablet; Take 1 tablet (800 mg total) by mouth every 8 (eight) hours as needed. -     atorvastatin (LIPITOR) 10 MG tablet; Take 1 tablet every other day  Chronic pain of right knee Crepitus in knee encourage to apply for [facility] assistance to refer to ortho   Mixed hyperlipidemia  Healthy lifestyle diet of fruits vegetables fish nuts whole grains and low saturated fat . Foods high in cholesterol or liver, fatty meats,cheese, butter avocados, nuts and seeds, chocolate and fried foods. -     atorvastatin (LIPITOR) 10 MG tablet; Take 1 tablet every other day   Essential hypertension -Counseled on lifestyle modifications for blood pressure control including reduced dietary sodium, increased exercise, weight reduction and adequate sleep. Also, educated patient about the risk for cardiovascular events, stroke and heart attack. Also counseled patient about the importance of medication adherence. If you participate in smoking, it is important to stop using tobacco as this will increase the risks associated with uncontrolled blood pressure.  Goal BP:  For patients younger than 60: Goal BP < 130/80. For patients 60 and older: Goal BP < 140/90. For patients with diabetes: Goal BP < 130/80. Your most recent BP: 120/81  Minimize salt intake. Minimize alcohol intake  -     Ambulatory referral to Ophthalmology  Mixed hyperlipidemia -     atorvastatin (LIPITOR) 10 MG tablet; Take 1 tablet every other day  Lipids panel   This note has been created with Surveyor, quantity. Any transcriptional errors are unintentional.   Kerin Perna, NP 07/08/2022, 11:10 AM

## 2022-07-08 NOTE — Patient Instructions (Signed)
Recuento de caloras para bajar de peso Calorie Counting for Weight Loss Las caloras son unidades de energa. El cuerpo necesita una cierta cantidad de caloras de los alimentos para que lo ayuden a funcionar durante todo el da. Cuando se comen o beben ms caloras de las que el cuerpo necesita, este acumula las caloras adicionales mayormente como grasa. Cuando se comen o beben menos caloras de las que el cuerpo necesita, este quema grasa para obtener la energa que necesita. El recuento de caloras es el registro de la cantidad de caloras que se comen y beben cada da. El recuento de caloras puede ser de ayuda si necesita perder peso. Si come menos caloras de las que el cuerpo necesita, debera bajar de peso. Pregntele al mdico cul es un peso sano para usted. Para que el recuento de caloras funcione, usted tendr que ingerir la cantidad de caloras adecuadas cada da, para bajar una cantidad de peso saludable por semana. Un nutricionista puede ayudar a determinar la cantidad de caloras que usted necesita por da y sugerirle formas de alcanzar su objetivo calrico. Una cantidad de peso saludable para bajar cada semana suele ser entre 1 y 2 libras (0.5 a 0.9 kg). Esto habitualmente significa que su ingesta diaria de caloras se debera reducir en unas 500 a 750 caloras. Ingerir de 1200 a 1500 caloras por da puede ayudar a la mayora de las mujeres a bajar de peso. Ingerir de 1500 a 1800 caloras por da puede ayudar a la mayora de los hombres a bajar de peso. Qu debo saber acerca del recuento de caloras? Trabaje con el mdico o el nutricionista para determinar cuntas caloras debe recibir cada da. A fin de alcanzar su objetivo diario de caloras, tendr que: Averiguar cuntas caloras hay en cada alimento que le gustara comer. Intente hacerlo antes de comer. Decidir la cantidad que puede comer del alimento. Llevar un registro de los alimentos. Para esto, anote lo que comi y cuntas  caloras tena. Para perder peso con xito, es importante equilibrar el recuento de caloras con un estilo de vida saludable que incluya actividad fsica de forma regular. Dnde encuentro informacin sobre las caloras?  Es posible encontrar la cantidad de caloras que contiene un alimento en la etiqueta de informacin nutricional. Si un alimento no tiene una etiqueta de informacin nutricional, intente buscar las caloras en Internet o pida ayuda al nutricionista. Recuerde que las caloras se calculan por porcin. Si opta por comer ms de una porcin de un alimento, tendr que multiplicar las caloras de una porcin por la cantidad de porciones que planea comer. Por ejemplo, la etiqueta de un envase de pan puede decir que el tamao de una porcin es 1 rodaja, y que una porcin tiene 90 caloras. Si come 1 rodaja, habr comido 90 caloras. Si come 2 rodajas, habr comido 180 caloras. Cmo llevo un registro de comidas? Despus de cada vez que coma, anote lo siguiente en el registro de alimentos lo antes posible: Lo que comi. Asegrese de incluir los aderezos, las salsas y otros extras en los alimentos. La cantidad que comi. Esto se puede medir en tazas, onzas o cantidad de alimentos. Cuntas caloras haba en cada alimento y en cada bebida. La cantidad total de caloras en la comida que tom. Tenga a mano el registro de alimentos, por ejemplo, en un anotador de bolsillo o utilice una aplicacin o sitio web en el telfono mvil. Algunos programas calcularn las caloras por usted y le mostrarn la cantidad de   caloras que le quedan para llegar al objetivo diario. Cules son algunos consejos para controlar las porciones? Sepa cuntas caloras hay en una porcin. Esto lo ayudar a saber cuntas porciones de un alimento determinado puede comer. Use una taza medidora para medir los tamaos de las porciones. Tambin puede intentar pesar las porciones en una balanza de cocina. Con el tiempo, podr hacer  un clculo estimativo de los tamaos de las porciones de algunos alimentos. Dedique tiempo a poner porciones de diferentes alimentos en sus platos, tazones y tazas predilectos, a fin de saber cmo se ve una porcin. Intente no comer directamente de un envase de alimentos, por ejemplo, de una bolsa o una caja. Comer directamente del envase dificulta ver cunto est comiendo y puede conducir a comer en exceso. Ponga la cantidad que le gustara comer en una taza o un plato, a fin de asegurarse de que est comiendo la porcin correcta. Use platos, vasos y tazones ms pequeos para medir porciones ms pequeas y evitar no comer en exceso. Intente no realizar varias tareas al mismo tiempo. Por ejemplo, evite mirar televisin o usar la computadora mientras come. Si es la hora de comer, sintese a la mesa y disfrute de la comida. Esto lo ayudar a reconocer cundo est satisfecho. Tambin le permitir estar ms consciente de qu come y cunto come. Consejos para seguir este plan Al leer las etiquetas de los alimentos Controle el recuento de caloras en comparacin con el tamao de la porcin. El tamao de la porcin puede ser ms pequeo de lo que suele comer. Verifique la fuente de las caloras. Intente elegir alimentos ricos en protenas, fibras y vitaminas, y bajos en grasas saturadas, grasas trans y sodio. Al ir de compras Lea las etiquetas nutricionales cuando compre. Esto lo ayudar a tomar decisiones saludables sobre qu alimentos comprar. Preste atencin a las etiquetas nutricionales de alimentos bajos en grasas o sin grasas. Estos alimentos a veces tienen la misma cantidad de caloras o ms caloras que las versiones ricas en grasas. Con frecuencia, tambin tienen agregados de azcar, almidn o sal, para darles el sabor que fue eliminado con las grasas. Haga una lista de compras con los alimentos que tienen un menor contenido de caloras y resptela. Al cocinar Intente cocinar sus alimentos preferidos  de una manera ms saludable. Por ejemplo, pruebe hornear en vez de frer. Utilice productos lcteos descremados. Planificacin de las comidas Utilice ms frutas y verduras. La mitad de su plato debe ser de frutas y verduras. Incluya protenas magras, como pollo, pavo y pescado. Estilo de vida Cada semana, trate de hacer una de las siguientes cosas: 150 minutos de ejercicio moderado, como caminar. 75 minutos de ejercicio enrgico, como correr. Informacin general Sepa cuntas caloras tienen los alimentos que come con ms frecuencia. Esto le ayudar a contar las caloras ms rpidamente. Encuentre un mtodo para controlar las caloras que funcione para usted. Sea creativo. Pruebe aplicaciones o programas distintos, si llevar un registro de las caloras no funciona para usted. Qu alimentos debo consumir?  Consuma alimentos nutritivos. Es mejor comer un alimento nutritivo, de alto contenido calrico, como un aguacate, que uno con pocos nutrientes, como una bolsa de patatas fritas. Use sus caloras en alimentos y bebidas que lo sacien y no lo dejen con apetito apenas termina de comer. Ejemplos de alimentos que lo sacian son los frutos secos y mantequillas de frutos secos, verduras, protenas magras y alimentos con alto contenido de fibra como los cereales integrales. Los alimentos con alto   contenido de fibra son aquellos que tienen ms de 5 g de fibra por porcin. Preste atencin a las caloras en las bebidas. Las bebidas de bajas caloras incluyen agua y refrescos sin azcar. Es posible que los productos que se enumeran ms arriba no constituyan una lista completa de los alimentos y las bebidas que puede tomar. Consulte a un nutricionista para obtener ms informacin. Qu alimentos debo limitar? Limite el consumo de alimentos o bebidas que no sean buenas fuentes de vitaminas, minerales o protenas, o que tengan alto contenido de grasas no saludables. Estos incluyen: Caramelos. Otros  dulces. Refrescos, bebidas con caf especiales, alcohol y jugo. Es posible que los productos que se enumeran ms arriba no constituyan una lista completa de los alimentos y las bebidas que debe evitar. Consulte a un nutricionista para obtener ms informacin. Cmo puedo hacer el recuento de caloras cuando como afuera? Preste atencin a las porciones. A menudo, las porciones son mucho ms grandes al comer afuera. Pruebe con estos consejos para mantener las porciones ms pequeas: Considere la posibilidad de compartir una comida en lugar de tomarla toda usted solo. Si pide su propia comida, coma solo la mitad. Antes de empezar a comer, pida un recipiente y ponga la mitad de la comida en l. Cuando sea posible, considere la posibilidad de pedir porciones ms pequeas del men en lugar de porciones completas. Preste atencin a la eleccin de alimentos y bebidas. Saber la forma en que se cocinan los alimentos y lo que incluye la comida puede ayudarlo a ingerir menos caloras. Si se detallan las caloras en el men, elija las opciones que contengan la menor cantidad. Elija platos que incluyan verduras, frutas, cereales integrales, productos lcteos con bajo contenido de grasa y protenas magras. Opte por los alimentos hervidos, asados, cocidos a la parrilla o al vapor. Evite los alimentos a los que se les ponga mantequilla, que estn empanados o fritos, o que se sirvan con salsa a base de crema. Generalmente, los alimentos que se etiquetan como "crujientes" estn fritos, a menos que se indique lo contrario. Elija el agua, la leche descremada, el t helado sin azcar u otras bebidas que no contengan azcares agregados. Si desea una bebida alcohlica, escoja una opcin con menos caloras, como una copa de vino o una cerveza ligera. Ordene los aderezos, las salsas y los jarabes aparte. Estos son, con frecuencia, de alto contenido en caloras, por lo que debe limitar la cantidad que ingiere. Si desea una  ensalada, elija una de hortalizas y pida carnes a la parrilla. Evite las guarniciones adicionales como el tocino, el queso o los alimentos fritos. Ordene el aderezo aparte o pida aceite de oliva y vinagre o limn para aderezar. Haga un clculo estimativo de la cantidad de porciones que le sirven. Conocer el tamao de las porciones lo ayudar a estar atento a la cantidad de comida que come en los restaurantes. Dnde buscar ms informacin Centers for Disease Control and Prevention (Centros para el Control y la Prevencin de Enfermedades): www.cdc.gov [place] Department of Agriculture (Departamento de Agricultura de los [place]): myplate.gov Resumen El recuento de caloras es el registro de la cantidad de caloras que se comen y beben cada da. Si come menos caloras de las que el cuerpo necesita, debera bajar de peso. Una cantidad de peso saludable para bajar por semana suele ser entre 1 y 2 libras (0.5 a 0.9 kg). Esto significa, con frecuencia, reducir su ingesta diaria de caloras unas 500 a 750 caloras. Es posible   encontrar la cantidad de caloras que contiene un alimento en la etiqueta de informacin nutricional. Si un alimento no tiene una etiqueta de informacin nutricional, intente buscar las caloras en Internet o pida ayuda al nutricionista. Use platos, vasos y tazones ms pequeos para medir porciones ms pequeas y evitar no comer en exceso. Use sus caloras en alimentos y bebidas que lo sacien y no lo dejen con apetito poco tiempo despus de haber comido. Esta informacin no tiene como fin reemplazar el consejo del mdico. Asegrese de hacerle al mdico cualquier pregunta que tenga. Document Revised: 09/12/2019 Document Reviewed: 09/12/2019 Elsevier Patient Education  2023 Elsevier Inc.  

## 2022-07-09 ENCOUNTER — Other Ambulatory Visit: Payer: Self-pay

## 2022-07-09 ENCOUNTER — Other Ambulatory Visit (INDEPENDENT_AMBULATORY_CARE_PROVIDER_SITE_OTHER): Payer: Self-pay | Admitting: Primary Care

## 2022-07-09 DIAGNOSIS — E119 Type 2 diabetes mellitus without complications: Secondary | ICD-10-CM

## 2022-07-09 LAB — CMP14+EGFR
ALT: 12 IU/L (ref 0–32)
AST: 11 IU/L (ref 0–40)
Albumin/Globulin Ratio: 1.7 (ref 1.2–2.2)
Albumin: 4.8 g/dL (ref 3.8–4.9)
Alkaline Phosphatase: 95 IU/L (ref 44–121)
BUN/Creatinine Ratio: 13 (ref 9–23)
BUN: 8 mg/dL (ref 6–24)
Bilirubin Total: 0.2 mg/dL (ref 0.0–1.2)
CO2: 25 mmol/L (ref 20–29)
Calcium: 9.8 mg/dL (ref 8.7–10.2)
Chloride: 97 mmol/L (ref 96–106)
Creatinine, Ser: 0.62 mg/dL (ref 0.57–1.00)
Globulin, Total: 2.9 g/dL (ref 1.5–4.5)
Glucose: 193 mg/dL — ABNORMAL HIGH (ref 70–99)
Potassium: 5 mmol/L (ref 3.5–5.2)
Sodium: 139 mmol/L (ref 134–144)
Total Protein: 7.7 g/dL (ref 6.0–8.5)
eGFR: 105 mL/min/{1.73_m2} (ref >59)

## 2022-07-09 LAB — CBC WITH DIFFERENTIAL/PLATELET
Basophils Absolute: 0 10*3/uL (ref 0.0–0.2)
Basos: 0 %
EOS (ABSOLUTE): 0.1 10*3/uL (ref 0.0–0.4)
Eos: 1 %
Hematocrit: 34.8 % (ref 34.0–46.6)
Hemoglobin: 11.4 g/dL (ref 11.1–15.9)
Immature Grans (Abs): 0 10*3/uL (ref 0.0–0.1)
Immature Granulocytes: 0 %
Lymphocytes Absolute: 1.7 10*3/uL (ref 0.7–3.1)
Lymphs: 24 %
MCH: 30.5 pg (ref 26.6–33.0)
MCHC: 32.8 g/dL (ref 31.5–35.7)
MCV: 93 fL (ref 79–97)
Monocytes Absolute: 0.3 10*3/uL (ref 0.1–0.9)
Monocytes: 5 %
Neutrophils Absolute: 4.9 10*3/uL (ref 1.4–7.0)
Neutrophils: 70 %
Platelets: 273 10*3/uL (ref 150–450)
RBC: 3.74 x10E6/uL — ABNORMAL LOW (ref 3.77–5.28)
RDW: 12.8 % (ref 11.7–15.4)
WBC: 7 10*3/uL (ref 3.4–10.8)

## 2022-07-09 LAB — LIPID PANEL
Chol/HDL Ratio: 2.9 ratio (ref 0.0–4.4)
Cholesterol, Total: 168 mg/dL (ref 100–199)
HDL: 57 mg/dL (ref >39)
LDL Chol Calc (NIH): 90 mg/dL (ref 0–99)
Triglycerides: 117 mg/dL (ref 0–149)
VLDL Cholesterol Cal: 21 mg/dL (ref 5–40)

## 2022-07-09 LAB — HEMOGLOBIN A1C
Est. average glucose Bld gHb Est-mCnc: 226 mg/dL
Hgb A1c MFr Bld: 9.5 % — ABNORMAL HIGH (ref 4.8–5.6)

## 2022-07-09 MED ORDER — GLIPIZIDE 10 MG PO TABS
10.0000 mg | ORAL_TABLET | Freq: Two times a day (BID) | ORAL | 3 refills | Status: DC
  Filled 2022-07-09: qty 60, 30d supply, fill #0

## 2022-07-09 MED ORDER — METFORMIN HCL 1000 MG PO TABS
1000.0000 mg | ORAL_TABLET | Freq: Two times a day (BID) | ORAL | 1 refills | Status: DC
  Filled 2022-07-09: qty 60, 30d supply, fill #0

## 2022-07-09 MED ORDER — INSULIN PEN NEEDLE 31G X 5 MM MISC
100.0000 | Freq: Three times a day (TID) | 3 refills | Status: DC
  Filled 2022-07-09: qty 100, 100d supply, fill #0

## 2022-07-09 MED ORDER — BASAGLAR KWIKPEN 100 UNIT/ML ~~LOC~~ SOPN
12.0000 [IU] | PEN_INJECTOR | Freq: Every day | SUBCUTANEOUS | 1 refills | Status: DC
  Filled 2022-07-09: qty 3, 25d supply, fill #0

## 2022-07-10 ENCOUNTER — Telehealth (INDEPENDENT_AMBULATORY_CARE_PROVIDER_SITE_OTHER): Payer: Self-pay

## 2022-07-10 NOTE — Telephone Encounter (Signed)
[employer]  Id#  [id number] contacted pt to go over lab results pt is aware and doesn't have any questions or concerns  

## 2022-07-16 ENCOUNTER — Other Ambulatory Visit: Payer: Self-pay

## 2022-07-17 LAB — FECAL OCCULT BLOOD, IMMUNOCHEMICAL: Fecal Occult Bld: NEGATIVE

## 2022-07-17 LAB — SPECIMEN STATUS REPORT

## 2023-01-06 ENCOUNTER — Ambulatory Visit (INDEPENDENT_AMBULATORY_CARE_PROVIDER_SITE_OTHER): Payer: Self-pay | Admitting: Primary Care

## 2023-01-27 ENCOUNTER — Ambulatory Visit (INDEPENDENT_AMBULATORY_CARE_PROVIDER_SITE_OTHER): Payer: Self-pay | Admitting: Primary Care

## 2023-01-27 ENCOUNTER — Encounter (INDEPENDENT_AMBULATORY_CARE_PROVIDER_SITE_OTHER): Payer: Self-pay | Admitting: Primary Care

## 2023-01-27 VITALS — BP 120/78 | Resp 16 | Wt 199.6 lb

## 2023-01-27 DIAGNOSIS — Z7984 Long term (current) use of oral hypoglycemic drugs: Secondary | ICD-10-CM

## 2023-01-27 DIAGNOSIS — E782 Mixed hyperlipidemia: Secondary | ICD-10-CM

## 2023-01-27 DIAGNOSIS — Z23 Encounter for immunization: Secondary | ICD-10-CM

## 2023-01-27 DIAGNOSIS — E119 Type 2 diabetes mellitus without complications: Secondary | ICD-10-CM

## 2023-01-27 DIAGNOSIS — I10 Essential (primary) hypertension: Secondary | ICD-10-CM

## 2023-01-27 DIAGNOSIS — Z760 Encounter for issue of repeat prescription: Secondary | ICD-10-CM

## 2023-01-27 LAB — POCT GLYCOSYLATED HEMOGLOBIN (HGB A1C): HbA1c, POC (controlled diabetic range): 9.3 % — AB (ref 0.0–7.0)

## 2023-01-27 MED ORDER — INSULIN PEN NEEDLE 31G X 5 MM MISC: 100.0000 | Freq: Three times a day (TID) | 3 refills | Status: DC

## 2023-01-27 MED ORDER — ATORVASTATIN CALCIUM 10 MG PO TABS: ORAL_TABLET | ORAL | 1 refills | Status: DC

## 2023-01-27 MED ORDER — BLOOD GLUCOSE TEST VI STRP: 1.0000 | ORAL_STRIP | Freq: Three times a day (TID) | 0 refills | Status: AC

## 2023-01-27 MED ORDER — LANCET DEVICE MISC: 1.0000 | Freq: Three times a day (TID) | 0 refills | Status: AC

## 2023-01-27 MED ORDER — GLIPIZIDE 10 MG PO TABS: 10.0000 mg | ORAL_TABLET | Freq: Two times a day (BID) | ORAL | 3 refills | Status: DC

## 2023-01-27 MED ORDER — METFORMIN HCL 1000 MG PO TABS: 1000.0000 mg | ORAL_TABLET | Freq: Two times a day (BID) | ORAL | 1 refills | Status: DC

## 2023-01-27 MED ORDER — LISINOPRIL 5 MG PO TABS: ORAL_TABLET | ORAL | 1 refills | Status: DC

## 2023-01-27 MED ORDER — BLOOD GLUCOSE MONITORING SUPPL DEVI: 1.0000 | Freq: Three times a day (TID) | 0 refills | Status: DC

## 2023-01-27 MED ORDER — LANCETS MISC. MISC: 1.0000 | Freq: Three times a day (TID) | 0 refills | Status: AC

## 2023-01-27 NOTE — Progress Notes (Signed)
Subjective:  Patient ID: Christine Pineda, female    DOB: [birth year]  Age: 57 y.o. 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 Patient does not check blood sugar at home  Compliant with meds - Yes Checking CBGs? No  Fasting avg -   Postprandial average -  Exercising regularly? - Yes Watching carbohydrate intake? - No Neuropathy ? - No Hypoglycemic events - No  - Recovers with :   Pertinent ROS:  Polyuria - No Polydipsia - NoBody mass index is 32.71 kg/m.  Vision problems - No  Medications as noted below. Taking them regularly without complication/adverse reaction being reported today.   History Sarea has a past medical history of Diabetes mellitus without complication (HCC) (2008).   She has a past surgical history that includes Tubal ligation (Bilateral, 06/02/2006) and Cesarean section.   Her family history includes Diabetes in her father.She reports that she has never smoked. She has never used smokeless tobacco. She reports that she does not drink alcohol and does not use drugs.  Current Outpatient Medications on File Prior to Visit  Medication Sig Dispense Refill   ibuprofen (ADVIL) 800 MG tablet Take 1 tablet (800 mg total) by mouth every 8 (eight) hours as needed. 90 tablet 1   Insulin Glargine (BASAGLAR KWIKPEN) 100 UNIT/ML Inject 12 Units into the skin daily. (Patient not taking: Reported on 01/27/2023) 15 mL 1   No current facility-administered medications on file prior to visit.    ROS Comprehensive ROS Pertinent positive and negative noted in HPI    Objective:  BP 120/78 (BP Location: Right Arm, Patient Position: Sitting, Cuff Size: Normal)   Resp 16   Wt 199 lb 9.6 oz (90.5 kg)   LMP 08/14/2020 (Exact Date) Comment: last 3-4 days light - spotting recently not a regular spotting  SpO2 99%   BMI 32.71 kg/m   BP Readings from Last 3  Encounters:  01/27/23 120/78  07/08/22 120/81  04/08/22 132/82    Wt Readings from Last 3 Encounters:  01/27/23 199 lb 9.6 oz (90.5 kg)  07/08/22 209 lb (94.8 kg)  04/08/22 213 lb (96.6 kg)    Physical Exam Vitals reviewed.  HENT:     Head: Normocephalic.     Right Ear: External ear normal.     Left Ear: External ear normal.  Eyes:     Extraocular Movements: Extraocular movements intact.  Pulmonary:     Effort: Pulmonary effort is normal.     Breath sounds: Normal breath sounds.  Abdominal:     General: Bowel sounds are normal. There is distension.     Palpations: Abdomen is soft.  Musculoskeletal:        General: Normal range of motion.     Cervical back: Normal range of motion.  Skin:    General: Skin is warm and dry.  Neurological:     Mental Status: She is alert and oriented to person, place, and time.  Psychiatric:        Mood and Affect: Mood normal.        Behavior: Behavior normal.    Lab Results  Component Value Date   HGBA1C 9.3 (A) 01/27/2023   HGBA1C 9.5 (H) 07/08/2022   HGBA1C 6.4 02/25/2022    Lab Results  Component Value Date   WBC 7.0 07/08/2022   HGB 11.4 07/08/2022   HCT 34.8 07/08/2022   PLT 273 07/08/2022  GLUCOSE 193 (H) 07/08/2022   CHOL 168 07/08/2022   TRIG 117 07/08/2022   HDL 57 07/08/2022   LDLCALC 90 07/08/2022   ALT 12 07/08/2022   AST 11 07/08/2022   NA 139 07/08/2022   K 5.0 07/08/2022   CL 97 07/08/2022   CREATININE 0.62 07/08/2022   BUN 8 07/08/2022   CO2 25 07/08/2022   TSH 1.750 03/11/2018   HGBA1C 9.3 (A) 01/27/2023   MICROALBUR <0.2 08/27/2015     Assessment & Plan:  Paul was seen today for diabetes and hypertension.  Diagnoses and all orders for this visit:  Type 2 diabetes mellitus without complication, without long-term current use of insulin (HCC) -     POCT glycosylated hemoglobin (Hb A1C) 9.3  Complications from uncontrolled diabetes -diabetic retinopathy leading to blindness, diabetic  nephropathy leading to dialysis, decrease in circulation decrease in sores or wound healing which may lead to amputations and increase of heart attack and stroke  -     Cancel: Microalbumin / creatinine urine ratio -     Microalbumin / creatinine urine ratio -     Ambulatory referral to Optometry -     glipiZIDE (GLUCOTROL) 10 MG tablet; Take 1 tablet (10 mg total) by mouth 2 (two) times daily before a meal. -     metFORMIN (GLUCOPHAGE) 1000 MG tablet; Take 1 tablet (1,000 mg total) by mouth 2 (two) times daily. -     Glucose Blood (BLOOD GLUCOSE TEST STRIPS) STRP; 1 each by In Vitro route in the morning, at noon, and at bedtime. May substitute to any manufacturer covered by patient's insurance. -     Lancet Device MISC; 1 each by Does not apply route in the morning, at noon, and at bedtime. May substitute to any manufacturer covered by patient's insurance. -     Lancets Misc. MISC; 1 each by Does not apply route in the morning, at noon, and at bedtime. May substitute to any manufacturer covered by patient's insurance.  Flu vaccine need -     Flu vaccine trivalent PF, 6mos and older(Flulaval,Afluria,Fluarix,Fluzone)  Comprehensive diabetic foot examination, type 2 DM, encounter for [facility]) Completed   Medication refill -     atorvastatin (LIPITOR) 10 MG tablet; Take 1 tablet every other day -     lisinopril (ZESTRIL) 5 MG tablet; TAKE 1 TABLET BY MOUTH ONCE DAILY .  Mixed hyperlipidemia -     atorvastatin (LIPITOR) 10 MG tablet; Take 1 tablet every other day  Essential hypertension -     lisinopril (ZESTRIL) 5 MG tablet; TAKE 1 TABLET BY MOUTH ONCE DAILY .  Other orders -     Insulin Pen Needle 31G X 5 MM MISC; Use with insulin. -     Blood Glucose Monitoring Suppl DEVI; 1 each by Does not apply route in the morning, at noon, and at bedtime. May substitute to any manufacturer covered by patient's insurance.    I have changed Ludwika Arroyo-Bustos's lisinopril. I am also having her start  on Blood Glucose Monitoring Suppl, BLOOD GLUCOSE TEST STRIPS, Lancet Device, and Lancets Misc.. Additionally, I am having her maintain her ibuprofen, Basaglar KwikPen, atorvastatin, glipiZIDE, Insulin Pen Needle, and metFORMIN.  Meds ordered this encounter  Medications   atorvastatin (LIPITOR) 10 MG tablet    Sig: Take 1 tablet every other day    Dispense:  90 tablet    Refill:  1    Order Specific Question:   Supervising Provider    Answer:  JEGEDE, OLUGBEMIGA E [[id number]]   glipiZIDE (GLUCOTROL) 10 MG tablet    Sig: Take 1 tablet (10 mg total) by mouth 2 (two) times daily before a meal.    Dispense:  60 tablet    Refill:  3    Order Specific Question:   Supervising Provider    Answer:   Quentin Angst [[id number]]   Insulin Pen Needle 31G X 5 MM MISC    Sig: Use with insulin.    Dispense:  100 each    Refill:  3    Order Specific Question:   Supervising Provider    Answer:   Quentin Angst [[id number]]   lisinopril (ZESTRIL) 5 MG tablet    Sig: TAKE 1 TABLET BY MOUTH ONCE DAILY .    Dispense:  90 tablet    Refill:  1    Order Specific Question:   Supervising Provider    Answer:   Quentin Angst [[id number]]   metFORMIN (GLUCOPHAGE) 1000 MG tablet    Sig: Take 1 tablet (1,000 mg total) by mouth 2 (two) times daily.    Dispense:  180 tablet    Refill:  1    Order Specific Question:   Supervising Provider    Answer:   Quentin Angst [id number]   Blood Glucose Monitoring Suppl DEVI    Sig: 1 each by Does not apply route in the morning, at noon, and at bedtime. May substitute to any manufacturer covered by patient's insurance.    Dispense:  1 each    Refill:  0    Order Specific Question:   Supervising Provider    Answer:   Quentin Angst [id number]   Glucose Blood (BLOOD GLUCOSE TEST STRIPS) STRP    Sig: 1 each by In Vitro route in the morning, at noon, and at bedtime. May substitute to any manufacturer covered by patient's insurance.    Dispense:  100 strip     Refill:  0    Order Specific Question:   Supervising Provider    Answer:   Quentin Angst [id number]   Lancet Device MISC    Sig: 1 each by Does not apply route in the morning, at noon, and at bedtime. May substitute to any manufacturer covered by patient's insurance.    Dispense:  1 each    Refill:  0    Order Specific Question:   Supervising Provider    Answer:   Quentin Angst [[id number]]   Lancets Misc. MISC    Sig: 1 each by Does not apply route in the morning, at noon, and at bedtime. May substitute to any manufacturer covered by patient's insurance.    Dispense:  100 each    Refill:  0    Order Specific Question:   Supervising Provider    Answer:   Quentin Angst [[id number]]     Follow-up:   Return in about 3 months (around 04/29/2023) for DM/fasting.  The above assessment and management plan was discussed with the patient. The patient verbalized understanding of and has agreed to the management plan. Patient is aware to call the clinic if symptoms fail to improve or worsen. Patient is aware when to return to the clinic for a follow-up visit. Patient educated on when it is appropriate to go to the emergency department.   Gwinda Passe, NP-C

## 2023-01-27 NOTE — Patient Instructions (Addendum)
Complicaciones de la diabetes no controlada: retinopata diabtica que provoca ceguera, nefropata diabtica que conduce a dilisis, disminucin de la circulacin, disminucin de las llagas o cicatrizacin de heridas que puede provocar amputaciones y aumento de ataques cardacos y accidentes cerebrovasculares.Recuento de caloras para bajar de peso Calorie Counting for [employer] Loss Las caloras son unidades de Engineer, drilling. El cuerpo necesita una cierta cantidad de caloras de los alimentos para que lo ayuden a funcionar durante todo Medical laboratory scientific officer. Cuando se comen o beben ms caloras de las que el cuerpo [place], este acumula las caloras adicionales mayormente como grasa. Cuando se comen o beben menos caloras de las que el cuerpo [place], este quema grasa para obtener la energa que necesita. El recuento de caloras es el registro de la cantidad de caloras que se comen y Audiological scientist. El recuento de caloras puede ser de ayuda si necesita perder peso. Si come menos caloras de las que el cuerpo necesita, debera bajar de [place]. Pregntele al mdico cul es un peso sano para usted. Para que el recuento de caloras funcione, usted tendr que ingerir la cantidad de caloras adecuadas cada da, para bajar una cantidad de peso saludable por semana. Un nutricionista puede ayudar a determinar la cantidad de caloras que usted necesita por da y sugerirle formas de Barista su objetivo calrico. Neomia Dear cantidad de peso saludable para bajar cada semana suele ser entre 1 y 2 libras (0.5 a 0.9 kg). Esto habitualmente significa que su ingesta diaria de caloras se debera reducir en unas 500 a 750 caloras. Ingerir de 1200 a 1500 caloras por Clinical research associate a la [employer] de las mujeres a Publishing copy de [place]. Ingerir de 1500 a 1800 caloras por Clinical research associate a la [employer] de los hombres a Publishing copy de [place]. Qu debo saber acerca del recuento de caloras? Trabaje con el mdico o el nutricionista para determinar cuntas caloras debe  recibir Management consultant. A fin de alcanzar su objetivo diario de caloras, tendr que: Averiguar cuntas caloras hay en cada alimento que le Lobbyist. Intente hacerlo antes de comer. Decidir la cantidad que puede comer del alimento. Llevar un registro de los alimentos. Para esto, anote lo que comi y cuntas caloras tena. Para perder peso con xito, es importante equilibrar el recuento de caloras con un estilo de vida saludable que incluya actividad fsica de forma regular. Dnde encuentro informacin sobre las caloras?  Es posible Veterinary surgeon cantidad de caloras que contiene un alimento en la etiqueta de informacin nutricional. Si un alimento no tiene una etiqueta de informacin nutricional, intente buscar las caloras en Internet o pida ayuda al nutricionista. Recuerde que las caloras se calculan por porcin. Si opta por comer ms de una porcin de un alimento, tendr [employer] las caloras de una porcin por la cantidad de porciones que planea comer. Por ejemplo, la etiqueta de un envase de pan puede decir que el tamao de una porcin es 1 rodaja, y que una porcin tiene 90 caloras. Si come 1 rodaja, habr comido 90 caloras. Si come 2 rodajas, habr comido 180 caloras. Cmo llevo un registro de comidas? Despus de cada vez que coma, anote lo siguiente en el registro de alimentos lo antes posible: Lo que comi. Asegrese de [employer], las salsas y otros extras [employer]. La cantidad que comi. Esto se puede medir en tazas, onzas o cantidad de alimentos. Cuntas caloras haba en cada alimento y en cada bebida. La cantidad total de caloras en la comida  que tom. Tenga a Scientific laboratory technician de alimentos, por ejemplo, en un anotador de bolsillo o utilice una aplicacin o sitio web en el telfono mvil. Algunos programas calcularn las caloras por usted y Insurance account manager la cantidad de caloras que le quedan para llegar al objetivo diario. Cules son algunos consejos  para controlar las porciones? Sepa cuntas caloras hay en una porcin. Esto lo ayudar a saber cuntas porciones de un alimento determinado puede comer. Use una taza medidora para medir los tamaos de las porciones. Tambin Hydrographic surveyor las porciones en una balanza de cocina. Con el tiempo, podr hacer un clculo estimativo de los tamaos de las porciones de algunos alimentos. Dedique tiempo a poner porciones de diferentes alimentos en sus platos, tazones y tazas predilectos, a fin de saber cmo se ve una porcin. Intente no comer directamente de un envase de alimentos, por ejemplo, de una bolsa o una caja. Comer directamente del envase dificulta ver cunto est comiendo y puede conducir a Actuary. Ponga la cantidad [employer] gustara comer en una taza o un plato, a fin de asegurarse de que est comiendo la porcin correcta. Use platos, vasos y tazones ms pequeos para medir porciones ms pequeas y Automotive engineer no comer en exceso. Intente no realizar varias tareas al [employer]. Por ejemplo, evite mirar televisin o usar la [employer] come. Si es la hora de comer, sintese a Museum/gallery conservator y disfrute de Chemical engineer. Esto lo ayudar a Public house manager cundo est satisfecho. Tambin le permitir estar ms consciente de qu come y cunto come. Consejos para seguir Surveyor, minerals Al leer las etiquetas de los alimentos Controle el recuento de caloras en comparacin con el tamao de la porcin. El tamao de la porcin puede ser ms pequeo de lo que suele comer. Verifique la fuente de las caloras. Intente elegir alimentos ricos en protenas, fibras y vitaminas, y bajos en grasas saturadas, grasas trans y [place]. Al ir de compras Lea las etiquetas nutricionales cuando compre. Esto lo ayudar a tomar decisiones saludables sobre qu alimentos comprar. Preste atencin a las etiquetas nutricionales de alimentos bajos en grasas o sin grasas. Estos alimentos a veces tienen la misma cantidad de caloras o ms  caloras que las versiones ricas en grasas. Con frecuencia, tambin tienen agregados de azcar, almidn o sal, para darles el sabor que fue eliminado con las grasas. Haga una lista de compras con los alimentos que tienen un menor contenido de caloras y Leisure centre manager. Al cocinar Intente cocinar sus alimentos preferidos de una manera ms saludable. Por ejemplo, pruebe hornear en vez de frer. Utilice productos lcteos descremados. Planificacin de las comidas Utilice ms frutas y verduras. La mitad de su plato debe ser de frutas y verduras. Incluya protenas magras, como pollo, pavo y [place]. Estilo de [employer], trate de hacer una de las siguientes cosas: 150 minutos de ejercicio moderado, como caminar. 75 minutos de ejercicio enrgico, como correr. Informacin general Sepa cuntas caloras tienen los alimentos que come con ms frecuencia. Esto le ayudar a contar las caloras ms rpidamente. Encuentre un mtodo para controlar las caloras que funcione para usted. Sea creativo. Pruebe aplicaciones o programas distintos, si llevar un registro de las caloras no funciona para usted. Qu alimentos debo consumir?  Consuma alimentos nutritivos. Es mejor comer un alimento nutritivo, de alto contenido calrico, como un aguacate, que uno con pocos nutrientes, como una bolsa de patatas fritas. Use sus caloras en alimentos y bebidas que lo sacien y no lo  dejen con apetito apenas termina de comer. Ejemplos de alimentos que lo sacian son los frutos secos y Civil engineer, contracting de frutos secos, verduras, Associate Professor y Forensic scientist con alto contenido de Research scientist (life sciences) como los cereales integrales. Los alimentos con alto contenido de [place] son aquellos que tienen ms de 5 g de fibra por porcin. Preste atencin a las [employer] Brands. Las bebidas de bajas caloras incluyen agua y refrescos sin International aid/development worker. Es posible que los productos que se enumeran ms [place] no constituyan una lista completa de los alimentos y  las bebidas que puede tomar. Consulte a un nutricionista para obtener ms informacin. Qu alimentos debo limitar? Limite el consumo de alimentos o bebidas que no sean buenas fuentes de vitaminas, minerales o protenas, o que tengan alto contenido de grasas no saludables. Estos incluyen: Caramelos. Otros dulces. Refrescos, bebidas con caf especiales, alcohol y [place]. Es posible que los productos que se enumeran ms [place] no constituyan una lista completa de los alimentos y las bebidas que Personnel officer. Consulte a un nutricionista para obtener ms informacin. Cmo puedo hacer el recuento de caloras cuando como afuera? Preste atencin a las porciones. A menudo, las porciones son mucho ms grandes al comer afuera. Pruebe con estos consejos para mantener las porciones ms pequeas: Considere la posibilidad de compartir una comida en lugar de tomarla toda usted solo. Si pide su propia comida, coma solo la mitad. Antes de empezar a comer, pida un recipiente y ponga la mitad de la comida en l. Cuando sea posible, considere la posibilidad de pedir porciones ms pequeas del men en lugar de porciones completas. Preste atencin a Soil scientist de alimentos y bebidas. Saber la forma en que se cocinan los alimentos y lo que incluye la comida puede ayudarlo a ingerir menos caloras. Si se detallan las caloras en el men, elija las opciones que contengan la menor cantidad. Elija platos que incluyan verduras, frutas, cereales integrales, productos lcteos con bajo contenido de grasa y Associate Professor. Opte por los alimentos hervidos, asados, cocidos a la parrilla o al vapor. Evite los alimentos a los que se les ponga mantequilla, que estn empanados o fritos, o que se sirvan con salsa a base de crema. Generalmente, los alimentos que se etiquetan como "crujientes" estn fritos, a menos que se indique lo contrario. Elija el agua, la [place], [place] t helado sin azcar u otras bebidas que no contengan  azcares agregados. Si desea una bebida alcohlica, escoja una opcin con menos caloras, como una copa de vino o una cerveza ligera. Ordene los [employer], las salsas y los jarabes aparte. Estos son, con frecuencia, de alto contenido en caloras, por lo que debe limitar la cantidad que ingiere. Si desea [place], elija una de hortalizas y pida carnes a la parrilla. Evite las guarniciones adicionales como el tocino, el queso o los alimentos fritos. Ordene el aderezo aparte o pida aceite de [place] y vinagre o limn para Emergency planning/management officer. Haga un clculo estimativo de la cantidad de porciones que le sirven. Conocer el tamao de las porciones lo ayudar a Theme park manager atento a la cantidad de comida que come [employer]. Dnde buscar ms informacin Centers for Disease Control and Prevention (Centros para el Control y la Prevencin de Enfermedades): FootballExhibition.com.br U.S. Department of Agriculture (Departamento de Agricultura de los EE. UU.): [email or web address].dk Resumen El recuento de caloras es el registro de la cantidad de caloras que se comen y Audiological scientist. Si come menos caloras de las que el cuerpo [place], Forensic psychologist  bajar [employer]. Una cantidad de peso saludable para bajar por semana suele ser entre 1 y 2 libras (0.5 a 0.9 kg). Esto significa, con frecuencia, reducir su ingesta diaria de caloras unas 500 a 750 caloras. Es posible Veterinary surgeon cantidad de caloras que contiene un alimento en la etiqueta de informacin nutricional. Si un alimento no tiene una etiqueta de informacin nutricional, intente buscar las caloras en Internet o pida ayuda al nutricionista. Use platos, vasos y tazones ms pequeos para medir porciones ms pequeas y Automotive engineer no comer en exceso. Use sus caloras en alimentos y bebidas que lo sacien y no lo dejen con apetito poco tiempo despus de haber comido. Esta informacin no tiene Theme park manager el consejo del mdico. Asegrese de hacerle al mdico cualquier pregunta que  tenga. Document Revised: 09/12/2019 Document Reviewed: 09/12/2019 Elsevier Patient Education  2023 [employer].

## 2023-01-28 LAB — MICROALBUMIN / CREATININE URINE RATIO
Creatinine, Urine: 56.3 mg/dL
Microalb/Creat Ratio: 20 mg/g{creat} (ref 0–29)
Microalbumin, Urine: 11.1 ug/mL

## 2023-01-31 ENCOUNTER — Other Ambulatory Visit (INDEPENDENT_AMBULATORY_CARE_PROVIDER_SITE_OTHER): Payer: Self-pay | Admitting: Primary Care

## 2023-01-31 DIAGNOSIS — Z760 Encounter for issue of repeat prescription: Secondary | ICD-10-CM

## 2023-01-31 DIAGNOSIS — I10 Essential (primary) hypertension: Secondary | ICD-10-CM

## 2023-02-03 NOTE — Telephone Encounter (Signed)
Unable to refill per protocol, Rx request is too soon. Last refill 01/27/23.E-Prescribing Status: Receipt confirmed by pharmacy (01/27/2023  3:32 PM EDT).  Requested Prescriptions  Pending Prescriptions Disp Refills   lisinopril (ZESTRIL) 5 MG tablet [Pharmacy Med Name: Lisinopril 5 MG Oral Tablet] 90 tablet 0    Sig: Take 1 tablet by mouth once daily     Cardiovascular:  ACE Inhibitors Failed - 01/31/2023 11:25 AM      Failed - Cr in normal range and within 180 days    Creat  Date Value Ref Range Status  08/03/2014 0.60 0.50 - 1.10 mg/dL Final   Creatinine, Ser  Date Value Ref Range Status  07/08/2022 0.62 0.57 - 1.00 mg/dL Final   Creatinine, Urine  Date Value Ref Range Status  08/25/2012 224.9 mg/dL Final         Failed - K in normal range and within 180 days    Potassium  Date Value Ref Range Status  07/08/2022 5.0 3.5 - 5.2 mmol/L Final         Passed - Patient is not pregnant      Passed - Last BP in normal range    BP Readings from Last 1 Encounters:  01/27/23 120/78         Passed - Valid encounter within last 6 months    Recent Outpatient Visits           1 week ago Type 2 diabetes mellitus without complication, without long-term current use of insulin (HCC)   [place] [facility] Grayce Sessions, NP   7 months ago Type 2 diabetes mellitus without complication, without long-term current use of insulin (HCC)   [place] [facility] Grayce Sessions, NP   11 months ago Type 2 diabetes mellitus without complication, without long-term current use of insulin (HCC)   [place] [facility] Grayce Sessions, NP   1 year ago Type 2 diabetes mellitus without complication, without long-term current use of insulin (HCC)   [place] [facility] Grayce Sessions, NP   1 year ago Type 2 diabetes mellitus without complication, without long-term current use of insulin [facility])   [place]  [facility] Grayce Sessions, NP       Future Appointments             In 2 months Randa Evens, Kinnie Scales, NP [place] [facility]

## 2023-04-29 ENCOUNTER — Ambulatory Visit (INDEPENDENT_AMBULATORY_CARE_PROVIDER_SITE_OTHER): Payer: Self-pay | Admitting: Primary Care

## 2023-04-29 ENCOUNTER — Encounter (INDEPENDENT_AMBULATORY_CARE_PROVIDER_SITE_OTHER): Payer: Self-pay | Admitting: Primary Care

## 2023-04-29 VITALS — BP 111/71 | HR 68 | Resp 16 | Wt 200.2 lb

## 2023-04-29 DIAGNOSIS — E66811 Obesity, class 1: Secondary | ICD-10-CM

## 2023-04-29 DIAGNOSIS — E119 Type 2 diabetes mellitus without complications: Secondary | ICD-10-CM

## 2023-04-29 DIAGNOSIS — Z7984 Long term (current) use of oral hypoglycemic drugs: Secondary | ICD-10-CM

## 2023-04-29 DIAGNOSIS — E669 Obesity, unspecified: Secondary | ICD-10-CM

## 2023-04-29 DIAGNOSIS — Z6831 Body mass index (BMI) 31.0-31.9, adult: Secondary | ICD-10-CM

## 2023-04-29 LAB — POCT GLYCOSYLATED HEMOGLOBIN (HGB A1C): HbA1c, POC (controlled diabetic range): 6.2 % (ref 0.0–7.0)

## 2023-04-29 NOTE — Progress Notes (Signed)
[facility]  Christine Pineda, is a 57 y.o. female  ZOX:[id number]  [id number]  DOB - [birth year]  Chief Complaint  Patient presents with   Diabetes       Subjective:   Christine Pineda is a 57 y.o. Hispanic female Christine Pineda [phone] interpreter) here today for a follow up visit. T2D  Complications from uncontrolled diabetes -diabetic retinopathy leading to blindness, diabetic nephropathy leading to dialysis, decrease in circulation decrease in sores or wound healing which may lead to amputations and increase of heart attack and stroke. ACE for renal protection.  Patient has No headache, No chest pain, No abdominal pain - No Nausea, No new weakness tingling or numbness, No Cough - shortness of breath. Concerned about mammogram provided her scholarship she is unable to read or write explained that's ok I can't read or write Spanish. Diabetes She presents for her follow-up diabetic visit. She has type 2 diabetes mellitus. No MedicAlert identification noted. The initial diagnosis of diabetes was made 17 years ago. There are no hypoglycemic associated symptoms. There are no diabetic associated symptoms. There are no hypoglycemic complications. Symptoms are stable. There are no diabetic complications. Risk factors for coronary artery disease include diabetes mellitus and obesity. Current diabetic treatment includes oral agent (dual therapy) and insulin injections. She is compliant with treatment all of the time. She is following a diabetic and generally healthy diet. When asked about meal planning, she reported none. She has not had a previous visit with a dietitian. She participates in exercise daily. There is no change in her home blood glucose trend. An ACE inhibitor/angiotensin II receptor blocker is being taken. She does not see a podiatrist.Eye exam is not current.    No problems updated.  No Known Allergies  Past Medical History:  Diagnosis Date   Diabetes  mellitus without complication (HCC) 2008    Current Outpatient Medications on File Prior to Visit  Medication Sig Dispense Refill   atorvastatin (LIPITOR) 10 MG tablet Take 1 tablet every other day 90 tablet 1   Blood Glucose Monitoring Suppl DEVI 1 each by Does not apply route in the morning, at noon, and at bedtime. May substitute to any manufacturer covered by patient's insurance. 1 each 0   glipiZIDE (GLUCOTROL) 10 MG tablet Take 1 tablet (10 mg total) by mouth 2 (two) times daily before a meal. 60 tablet 3   ibuprofen (ADVIL) 800 MG tablet Take 1 tablet (800 mg total) by mouth every 8 (eight) hours as needed. 90 tablet 1   Insulin Glargine (BASAGLAR KWIKPEN) 100 UNIT/ML Inject 12 Units into the skin daily. (Patient not taking: Reported on 01/27/2023) 15 mL 1   Insulin Pen Needle 31G X 5 MM MISC Use with insulin. 100 each 3   lisinopril (ZESTRIL) 5 MG tablet TAKE 1 TABLET BY MOUTH ONCE DAILY . 90 tablet 1   metFORMIN (GLUCOPHAGE) 1000 MG tablet Take 1 tablet (1,000 mg total) by mouth 2 (two) times daily. 180 tablet 1   No current facility-administered medications on file prior to visit.    Objective:   Vitals:   04/29/23 1522  BP: 111/71  Pulse: 68  Resp: 16  SpO2: 100%  Weight: 200 lb 3.2 oz (90.8 kg)    Comprehensive ROS Pertinent positive and negative noted in HPI   Exam General appearance : Awake, alert, not in any distress.Obese female. Speech Clear. Not toxic looking HEENT: Atraumatic and Normocephalic, pupils equally reactive to light and accomodation Neck: Supple, no JVD. No  cervical lymphadenopathy.  Chest: Good air entry bilaterally, no added sounds  CVS: S1 S2 regular, no murmurs.  Abdomen: Bowel sounds present, Non tender and not distended with no gaurding, rigidity or rebound. Extremities: B/L Lower Ext shows no edema, both legs are warm to touch Neurology: Awake alert, and oriented X 3, CN II-XII intact, Non focal Skin: No Rash  Data Review Lab Results   Component Value Date   HGBA1C 6.2 04/29/2023   HGBA1C 9.3 (A) 01/27/2023   HGBA1C 9.5 (H) 07/08/2022    Assessment & Plan  Diabetes She presents for her follow-up diabetic visit. She has type 2 diabetes mellitus. No MedicAlert identification noted. There are no hypoglycemic associated symptoms. There are no diabetic associated symptoms. There are no hypoglycemic complications. Symptoms are stable. There are no diabetic complications. Risk factors for coronary artery disease include diabetes mellitus and obesity. Current diabetic treatment includes oral agent (dual therapy) and insulin injections. She is compliant with treatment all of the time.     Patient have been counseled extensively about nutrition and exercise. Other issues discussed during this visit include: low cholesterol diet, weight control and daily exercise, foot care, annual eye examinations at Ophthalmology, importance of adherence with medications and regular follow-up. We also discussed long term complications of uncontrolled diabetes and hypertension.   Return in about 3 months (around 07/30/2023) for medical conditions.  The patient was given clear instructions to go to ER or return to medical center if symptoms don't improve, worsen or new problems develop. The patient verbalized understanding. The patient was told to call to get lab results if they haven't heard anything in the next week.   This note has been created with Education officer, environmental. Any transcriptional errors are unintentional.   Grayce Sessions, NP 04/29/2023, 3:53 PM

## 2023-05-28 ENCOUNTER — Other Ambulatory Visit (INDEPENDENT_AMBULATORY_CARE_PROVIDER_SITE_OTHER): Payer: Self-pay | Admitting: Primary Care

## 2023-05-28 DIAGNOSIS — E119 Type 2 diabetes mellitus without complications: Secondary | ICD-10-CM

## 2023-05-28 NOTE — Telephone Encounter (Signed)
Will forward to provider  

## 2023-07-03 ENCOUNTER — Other Ambulatory Visit: Payer: Self-pay | Admitting: Obstetrics and Gynecology

## 2023-07-03 DIAGNOSIS — Z1231 Encounter for screening mammogram for malignant neoplasm of breast: Secondary | ICD-10-CM

## 2023-07-25 ENCOUNTER — Other Ambulatory Visit (INDEPENDENT_AMBULATORY_CARE_PROVIDER_SITE_OTHER): Payer: Self-pay | Admitting: Primary Care

## 2023-07-25 DIAGNOSIS — E119 Type 2 diabetes mellitus without complications: Secondary | ICD-10-CM

## 2023-07-27 NOTE — Telephone Encounter (Signed)
 Requested Prescriptions  Pending Prescriptions Disp Refills   metFORMIN (GLUCOPHAGE) 1000 MG tablet [Pharmacy Med Name: metFORMIN HCl 1000 MG Oral Tablet] 180 tablet 0    Sig: Take 1 tablet by mouth twice daily     Endocrinology:  Diabetes - Biguanides Failed - 07/27/2023  4:21 PM      Failed - Cr in normal range and within 360 days    Creat  Date Value Ref Range Status  08/03/2014 0.60 0.50 - 1.10 mg/dL Final   Creatinine, Ser  Date Value Ref Range Status  07/08/2022 0.62 0.57 - 1.00 mg/dL Final   Creatinine, Urine  Date Value Ref Range Status  08/25/2012 224.9 mg/dL Final         Failed - eGFR in normal range and within 360 days    GFR calc Af Amer  Date Value Ref Range Status  02/01/2020 120 >59 mL/min/1.73 Final    Comment:    **[facility] currently reports eGFR in compliance with the current**   recommendations of the [employer]. [facility] will   update reporting as new guidelines are published from the NKF-ASN   Task force.    GFR calc non Af Amer  Date Value Ref Range Status  02/01/2020 104 >59 mL/min/1.73 Final   eGFR  Date Value Ref Range Status  07/08/2022 105 >59 mL/min/1.73 Final         Failed - B12 Level in normal range and within 720 days    No results found for: "VITAMINB12"       Failed - CBC within normal limits and completed in the last 12 months    WBC  Date Value Ref Range Status  07/08/2022 7.0 3.4 - 10.8 x10E3/uL Final  08/03/2014 6.9 4.0 - 10.5 K/uL Final   RBC  Date Value Ref Range Status  07/08/2022 3.74 (L) 3.77 - 5.28 x10E6/uL Final  08/03/2014 4.09 3.87 - 5.11 MIL/uL Final   Hemoglobin  Date Value Ref Range Status  07/08/2022 11.4 11.1 - 15.9 g/dL Final   Hematocrit  Date Value Ref Range Status  07/08/2022 34.8 34.0 - 46.6 % Final   MCHC  Date Value Ref Range Status  07/08/2022 32.8 31.5 - 35.7 g/dL Final  60/45/4098 11.9 30.0 - 36.0 g/dL Final   [facility]  Date Value Ref Range Status  07/08/2022 30.5 26.6 - 33.0  pg Final  08/03/2014 28.6 26.0 - 34.0 pg Final   MCV  Date Value Ref Range Status  07/08/2022 93 79 - 97 fL Final   No results found for: "PLTCOUNTKUC", "LABPLAT", "POCPLA" RDW  Date Value Ref Range Status  07/08/2022 12.8 11.7 - 15.4 % Final         Passed - HBA1C is between 0 and 7.9 and within 180 days    HbA1c, POC (controlled diabetic range)  Date Value Ref Range Status  04/29/2023 6.2 0.0 - 7.0 % Final         Passed - Valid encounter within last 6 months    Recent Outpatient Visits           2 months ago Type 2 diabetes mellitus without complication, without long-term current use of insulin (HCC)   [place] [facility] Grayce Sessions, NP   6 months ago Type 2 diabetes mellitus without complication, without long-term current use of insulin (HCC)   [place] [facility] Grayce Sessions, NP   1 year ago Type 2 diabetes mellitus without complication, without long-term current  use of insulin (HCC)   [place] [facility] Grayce Sessions, NP   1 year ago Type 2 diabetes mellitus without complication, without long-term current use of insulin (HCC)   [place] [facility] Grayce Sessions, NP   1 year ago Type 2 diabetes mellitus without complication, without long-term current use of insulin [facility])   [place] [facility] Grayce Sessions, NP       Future Appointments             In 1 week Randa Evens, Kinnie Scales, NP [place] [facility]

## 2023-08-04 ENCOUNTER — Encounter (INDEPENDENT_AMBULATORY_CARE_PROVIDER_SITE_OTHER): Payer: Self-pay | Admitting: Primary Care

## 2023-08-04 ENCOUNTER — Ambulatory Visit (INDEPENDENT_AMBULATORY_CARE_PROVIDER_SITE_OTHER): Payer: Self-pay | Admitting: Primary Care

## 2023-08-04 VITALS — BP 132/73 | HR 78 | Temp 97.6°F | Resp 14 | Wt 205.0 lb

## 2023-08-04 DIAGNOSIS — E66811 Obesity, class 1: Secondary | ICD-10-CM

## 2023-08-04 DIAGNOSIS — Z760 Encounter for issue of repeat prescription: Secondary | ICD-10-CM

## 2023-08-04 DIAGNOSIS — I10 Essential (primary) hypertension: Secondary | ICD-10-CM

## 2023-08-04 DIAGNOSIS — E119 Type 2 diabetes mellitus without complications: Secondary | ICD-10-CM

## 2023-08-04 DIAGNOSIS — Z7984 Long term (current) use of oral hypoglycemic drugs: Secondary | ICD-10-CM

## 2023-08-04 DIAGNOSIS — E669 Obesity, unspecified: Secondary | ICD-10-CM

## 2023-08-04 DIAGNOSIS — Z6833 Body mass index (BMI) 33.0-33.9, adult: Secondary | ICD-10-CM

## 2023-08-04 LAB — POCT GLYCOSYLATED HEMOGLOBIN (HGB A1C): HbA1c, POC (controlled diabetic range): 6.1 % (ref 0.0–7.0)

## 2023-08-04 MED ORDER — GLIPIZIDE 10 MG PO TABS: 10.0000 mg | ORAL_TABLET | Freq: Two times a day (BID) | ORAL | 0 refills | Status: DC

## 2023-08-04 MED ORDER — LISINOPRIL 5 MG PO TABS: ORAL_TABLET | ORAL | 1 refills | Status: DC

## 2023-08-04 NOTE — Progress Notes (Unsigned)
 [facility]  [name], is a 58 y.o. female  BMW:[id number]  UVO:[id number]  DOB - April 19, 1966  Chief Complaint  Patient presents with   Diabetes       Subjective:  Christine Pineda [id number] Christine Pineda is a 58 y.o. female here today for a follow up visit. Patient has No headache, No chest pain, No abdominal pain - No Nausea, No new weakness tingling or numbness, No Cough - shortness of breath Diabetes    No problems updated.  Comprehensive ROS Pertinent positive and negative noted in HPI   No Known Allergies  Past Medical History:  Diagnosis Date   Diabetes mellitus without complication (HCC) 2008    Current Outpatient Medications on File Prior to Visit  Medication Sig Dispense Refill   atorvastatin (LIPITOR) 10 MG tablet Take 1 tablet every other day 90 tablet 1   ibuprofen (ADVIL) 800 MG tablet Take 1 tablet (800 mg total) by mouth every 8 (eight) hours as needed. 90 tablet 1   metFORMIN (GLUCOPHAGE) 1000 MG tablet Take 1 tablet by mouth twice daily 180 tablet 0   Blood Glucose Monitoring Suppl DEVI 1 each by Does not apply route in the morning, at noon, and at bedtime. May substitute to any manufacturer covered by patient's insurance. (Patient not taking: Reported on 08/04/2023) 1 each 0   Insulin Pen Needle 31G X 5 MM MISC Use with insulin. 100 each 3   No current facility-administered medications on file prior to visit.   Health Maintenance  Topic Date Due   Eye exam for diabetics  02/16/2014   Yearly kidney function blood test for diabetes  07/09/2023   COVID-19 Vaccine (4 - 2024-25 season) 08/19/2024*   Hemoglobin A1C  11/04/2023   Mammogram  04/08/2024   Yearly kidney health urinalysis for diabetes  08/03/2024   Complete foot exam   08/03/2024   Pap with HPV screening  05/01/2026   DTaP/Tdap/Td vaccine (2 - Td or Tdap) 12/11/2026   Pneumococcal Vaccination  Completed   Flu Shot  Completed   Hepatitis C Screening  Completed    HIV Screening  Completed   Zoster (Shingles) Vaccine  Completed   HPV Vaccine  Aged Out   Cologuard (Stool DNA test)  Discontinued   Meningitis B Vaccine  Discontinued  *Topic was postponed. The date shown is not the original due date.    Objective:   Vitals:   08/04/23 1551  BP: 132/73  Pulse: 78  Resp: 14  Temp: 97.6 F (36.4 C)  TempSrc: Oral  SpO2: 99%  Weight: 205 lb (93 kg)   BP Readings from Last 3 Encounters:  08/04/23 132/73  04/29/23 111/71  01/27/23 120/78      Physical Exam Vitals reviewed.  Constitutional:      Appearance: Normal appearance. She is obese.  HENT:     Head: Normocephalic.     Right Ear: Tympanic membrane, ear canal and external ear normal.     Left Ear: Tympanic membrane, ear canal and external ear normal.     Nose: Nose normal.     Mouth/Throat:     Mouth: Mucous membranes are moist.  Eyes:     Extraocular Movements: Extraocular movements intact.     Pupils: Pupils are equal, round, and reactive to light.  Cardiovascular:     Rate and Rhythm: Normal rate.  Pulmonary:     Effort: Pulmonary effort is normal.     Breath sounds: Normal breath sounds.  Abdominal:  General: Bowel sounds are normal.     Palpations: Abdomen is soft.  Musculoskeletal:        General: Normal range of motion.     Cervical back: Normal range of motion.  Skin:    General: Skin is warm and dry.  Neurological:     Mental Status: She is alert and oriented to person, place, and time.  Psychiatric:        Mood and Affect: Mood normal.        Behavior: Behavior normal.        Thought Content: Thought content normal.     Assessment & Plan  Rosali was seen today for diabetes.  Diagnoses and all orders for this visit:  Type 2 diabetes mellitus without complication, without long-term current use of insulin (HCC) -     POCT glycosylated hemoglobin (Hb A1C) -     glipiZIDE (GLUCOTROL) 10 MG tablet; Take 1 tablet (10 mg total) by mouth 2 (two) times daily  before a meal. -     Microalbumin / creatinine urine ratio  Medication refill -     lisinopril (ZESTRIL) 5 MG tablet; TAKE 1 TABLET BY MOUTH ONCE DAILY .  Essential hypertension -     lisinopril (ZESTRIL) 5 MG tablet; TAKE 1 TABLET BY MOUTH ONCE DAILY .      Patient have been counseled extensively about nutrition and exercise. Other issues discussed during this visit include: low cholesterol diet, weight control and daily exercise, foot care, annual eye examinations at Ophthalmology, importance of adherence with medications and regular follow-up. We also discussed long term complications of uncontrolled diabetes and hypertension.   Return in about 3 months (around 11/04/2023) for fasting labs.  The patient was given clear instructions to go to ER or return to medical center if symptoms don't improve, worsen or new problems develop. The patient verbalized understanding. The patient was told to call to get lab results if they haven't heard anything in the next week.   This note has been created with Education officer, environmental. Any transcriptional errors are unintentional.   Grayce Sessions, NP 08/05/2023, 9:13 PM

## 2023-08-05 LAB — MICROALBUMIN / CREATININE URINE RATIO
Creatinine, Urine: 10.3 mg/dL
Microalb/Creat Ratio: <29 mg/g{creat} (ref 0–29)
Microalbumin, Urine: <3 ug/mL

## 2023-08-27 ENCOUNTER — Ambulatory Visit (INDEPENDENT_AMBULATORY_CARE_PROVIDER_SITE_OTHER): Payer: Self-pay | Admitting: Hematology and Oncology

## 2023-08-27 ENCOUNTER — Ambulatory Visit
Admission: RE | Admit: 2023-08-27 | Discharge: 2023-08-27 | Disposition: A | Discharge status: Home / Self Care | Payer: Self-pay | Source: Ambulatory Visit | Attending: Obstetrics and Gynecology | Admitting: Obstetrics and Gynecology

## 2023-08-27 VITALS — BP 127/73 | Wt 202.9 lb

## 2023-08-27 DIAGNOSIS — Z1231 Encounter for screening mammogram for malignant neoplasm of breast: Secondary | ICD-10-CM

## 2023-08-27 DIAGNOSIS — Z1211 Encounter for screening for malignant neoplasm of colon: Secondary | ICD-10-CM

## 2023-08-27 NOTE — Patient Instructions (Signed)
 Taught Christine Pineda about self breast awareness and gave educational materials to take home. Patient did not need a Pap smear today due to last Pap smear was in 05/01/2021 per patient. Told patient about free cervical cancer screenings to receive a Pap smear if would like one next year. Let her know [facility] will cover Pap smears every 5 years unless has a history of abnormal Pap smears. Referred [facility] to the [facility] for screening mammogram. Appointment scheduled for 08/27/2023. Patient aware of appointment and will be there. Let patient know will follow up with her within the next couple weeks with results. Christine Pineda verbalized understanding.  Pascal Lux, NP 2:55 PM

## 2023-08-27 NOTE — Progress Notes (Signed)
 Ms. Lizzeth Meder is a 58 y.o. female who presents to [facility] clinic today with no complaints.    Pap Smear: Pap not smear completed today. Last Pap smear was 05/01/2021 and was normal. Per patient has no history of an abnormal Pap smear. Last Pap smear result is available in Epic.   Physical exam: Breasts Breasts symmetrical. No skin abnormalities bilateral breasts. No nipple retraction bilateral breasts. No nipple discharge bilateral breasts. No lymphadenopathy. No lumps palpated bilateral breasts.       Pelvic/Bimanual Pap is not indicated today    Smoking History: Patient has never smoked and was not referred to quit line.    Patient Navigation: Patient education provided. Access to services provided for patient through [facility] program. Natale Lay interpreter provided. No transportation provided   Colorectal Cancer Screening: Per patient has never had colonoscopy completed No complaints today. FIT test given.    Breast and Cervical Cancer Risk Assessment: Patient does not have family history of breast cancer, known genetic mutations, or radiation treatment to the chest before age 1. Patient does not have history of cervical dysplasia, immunocompromised, or DES exposure in-utero.  Risk Scores as of Encounter on 08/27/2023     Dondra Spry           5-year 1.25%   Lifetime 8.14%   This patient is His[place]/Latina but has no documented birth country, so the [place] model used data from [place] patients to calculate their risk score. Document a birth country in the Demographics activity for a more accurate score.         Last calculated by Caprice Red, CMA on 08/27/2023 at  2:37 PM        A: [facility] exam without pap smear No complaints with benign exam.   P: Referred patient to the [facility] for a screening mammogram. Appointment scheduled 08/27/2023.  Pascal Lux, NP 08/27/2023 2:53 PM

## 2023-09-09 LAB — FECAL OCCULT BLOOD, IMMUNOCHEMICAL: Fecal Occult Bld: NEGATIVE

## 2023-09-27 IMAGING — MG MM DIGITAL SCREENING BILAT W/ TOMO AND CAD
7 series · 8 of 23 positions shown · non-contrast
Comparison: Previous exam(s).

CLINICAL DATA: Screening.

EXAM:
DIGITAL SCREENING BILATERAL MAMMOGRAM WITH TOMOSYNTHESIS AND CAD
TECHNIQUE: Bilateral screening digital craniocaudal and mediolateral oblique
mammograms were obtained. Bilateral screening digital breast
tomosynthesis was performed. The images were evaluated with
computer-aided detection.

[R MLO synth-2D]
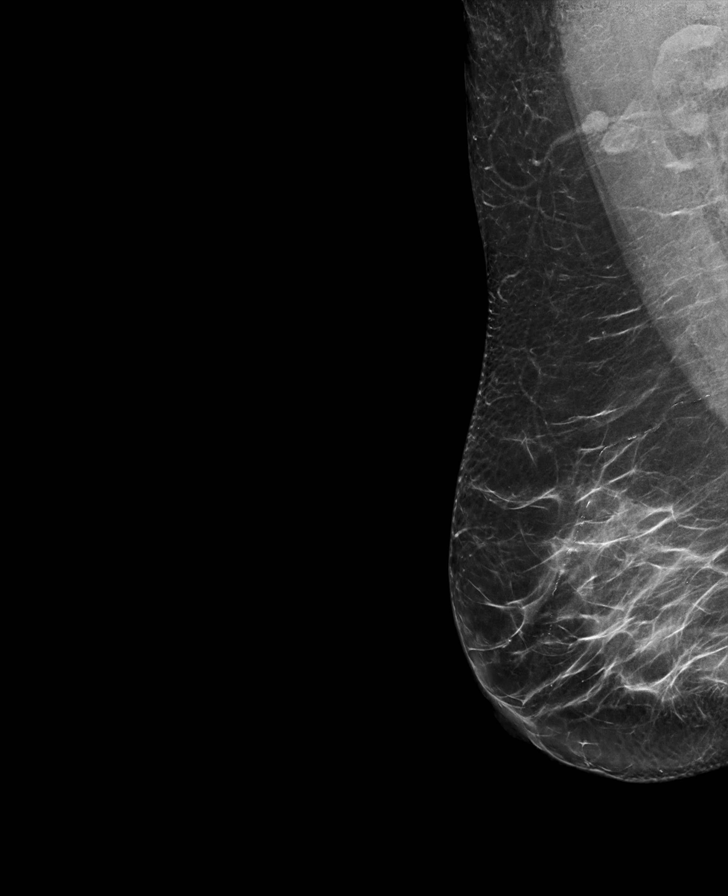

[L MLO synth-2D]
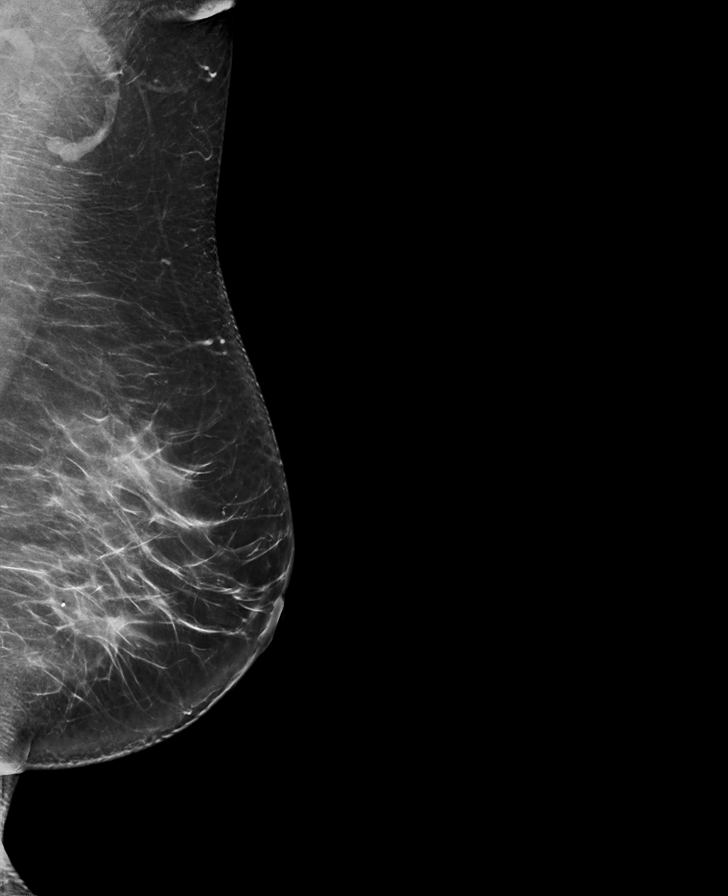

[L CC synth-2D]
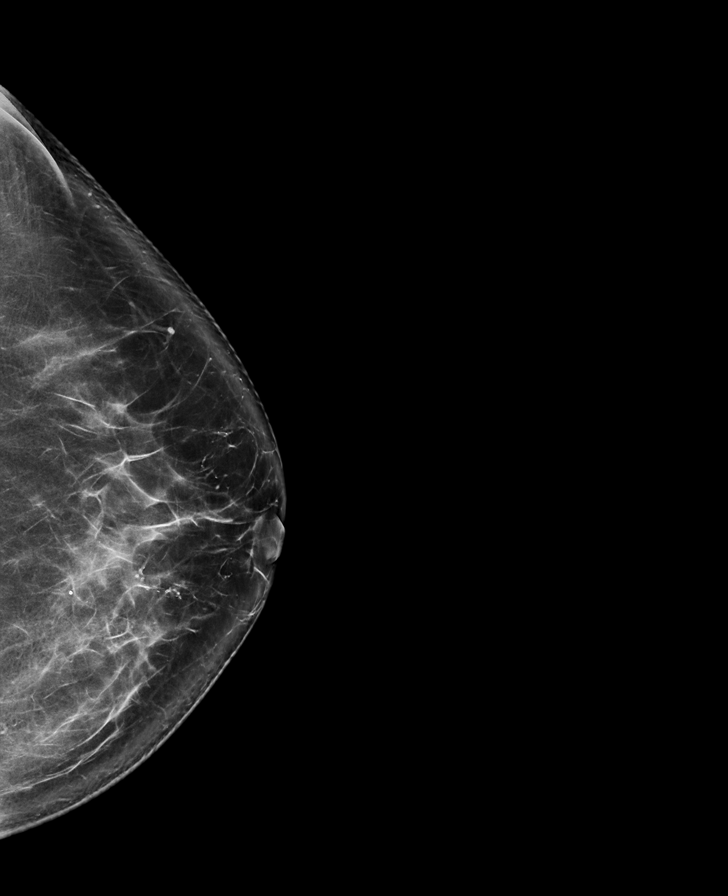

[R CC tomo · 2 of 87 frames shown]
[frame 29/87]
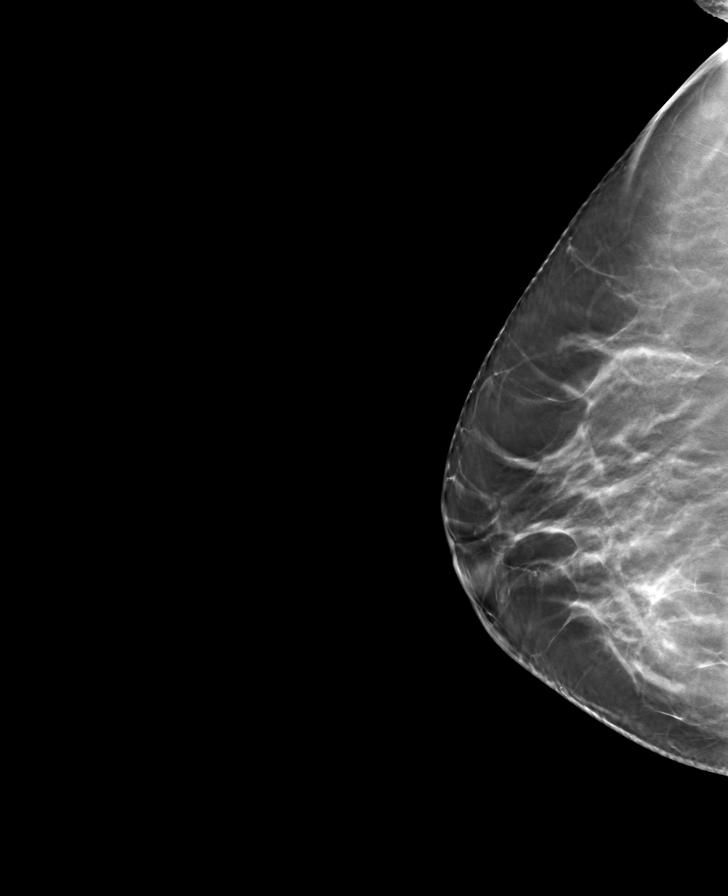
[frame 44/87]
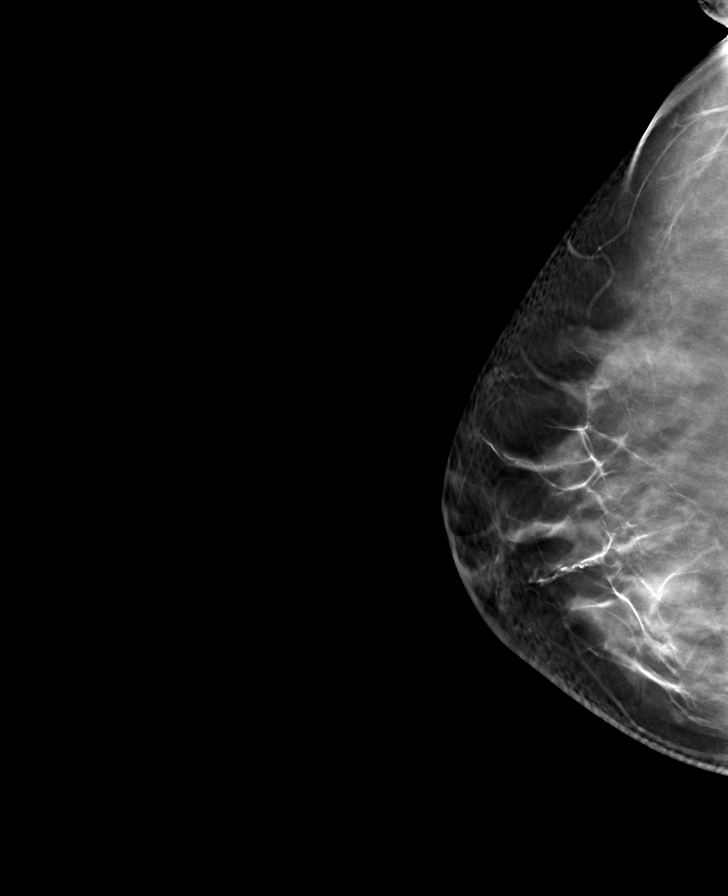

[R MLO tomo · tomo slice 42/83.0]
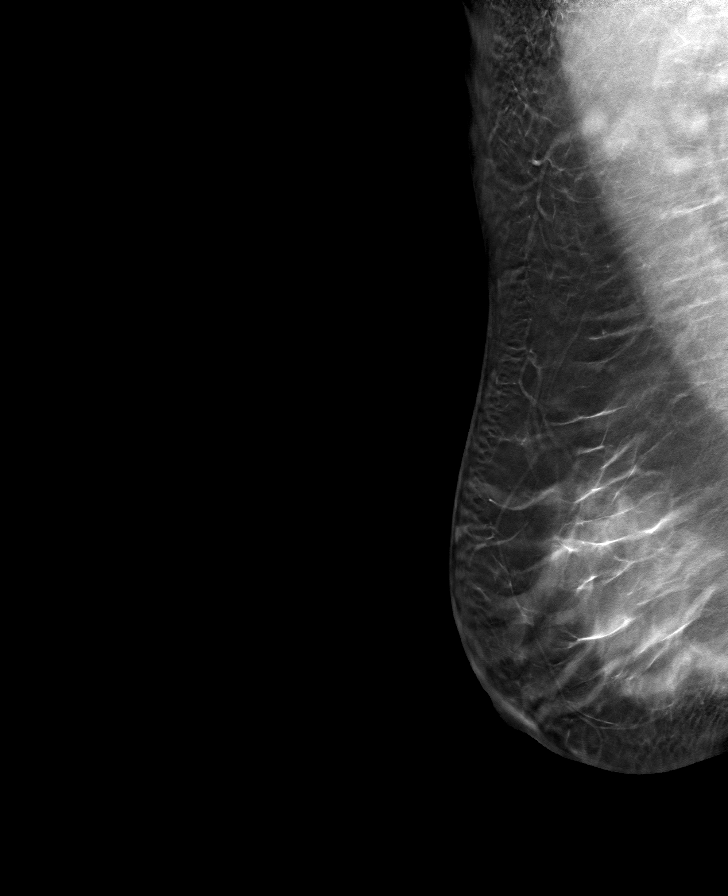

[L CC tomo · tomo slice 43/86.0]
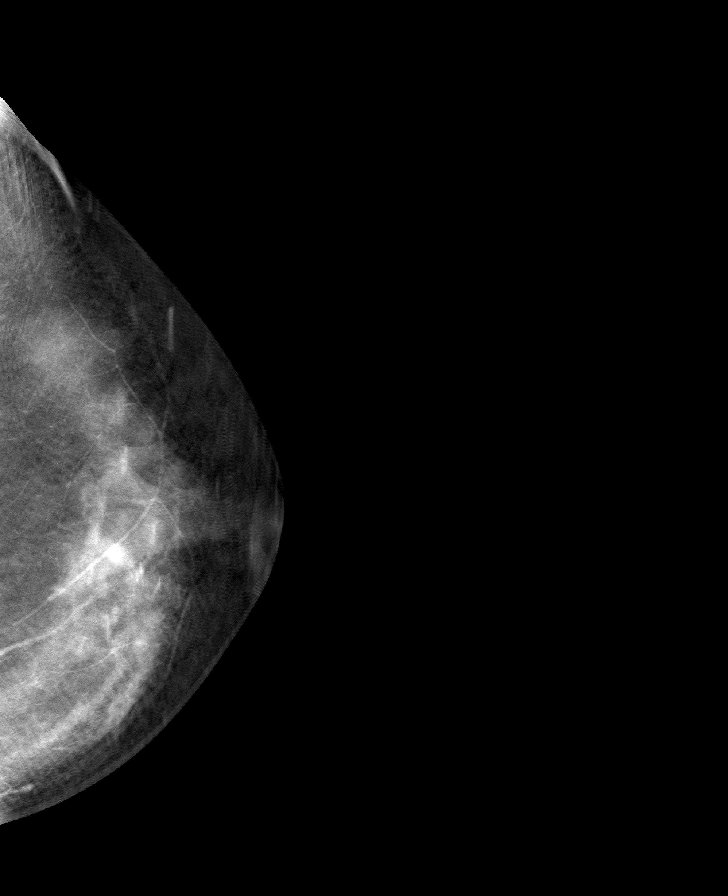

[L MLO tomo · tomo slice 43/84.0]
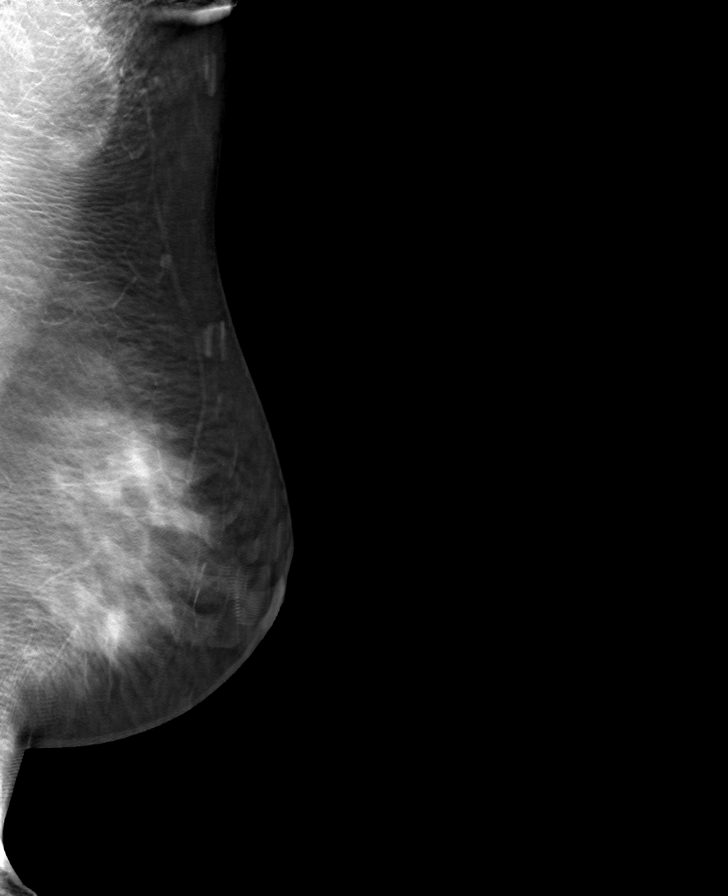

[8 of 23 positions shown; findings below may reference images not displayed]

ACR Breast Density Category c: The breast tissue is heterogeneously
dense, which may obscure small masses.
FINDINGS: There are no findings suspicious for malignancy.
IMPRESSION: No mammographic evidence of malignancy. A result letter of this
screening mammogram will be mailed directly to the patient.

RECOMMENDATION:
Screening mammogram in one year. (Code:Q3-W-BC3)

BI-RADS CATEGORY  1: Negative.

## 2023-11-09 ENCOUNTER — Telehealth (INDEPENDENT_AMBULATORY_CARE_PROVIDER_SITE_OTHER): Payer: Self-pay | Admitting: Primary Care

## 2023-11-09 NOTE — Telephone Encounter (Signed)
 Called pt to confirm appt. Pt was unavailable.

## 2023-11-10 ENCOUNTER — Ambulatory Visit (INDEPENDENT_AMBULATORY_CARE_PROVIDER_SITE_OTHER): Payer: Self-pay | Admitting: Primary Care

## 2023-12-02 ENCOUNTER — Ambulatory Visit (INDEPENDENT_AMBULATORY_CARE_PROVIDER_SITE_OTHER): Payer: Self-pay | Admitting: Primary Care

## 2024-02-02 ENCOUNTER — Other Ambulatory Visit (INDEPENDENT_AMBULATORY_CARE_PROVIDER_SITE_OTHER): Payer: Self-pay | Admitting: Primary Care

## 2024-02-02 DIAGNOSIS — I10 Essential (primary) hypertension: Secondary | ICD-10-CM

## 2024-02-02 DIAGNOSIS — E119 Type 2 diabetes mellitus without complications: Secondary | ICD-10-CM

## 2024-02-02 DIAGNOSIS — Z760 Encounter for issue of repeat prescription: Secondary | ICD-10-CM

## 2024-02-02 NOTE — Telephone Encounter (Unsigned)
 Copied from CRM #[id number]. Topic: Clinical - Prescription Issue >> Feb 02, 2024  3:56 PM Christine Pineda wrote: Reason for CRM: pt is calling she is out of her rx  lisinopril  (ZESTRIL ) 5 MG tablet glipiZIDE  (GLUCOTROL ) 10 MG tablet  andmetFORMIN (GLUCOPHAGE ) 1000 MG pt has appt 09/24 but needs rx before appt wants to know if she can get samples please call patient [phone]

## 2024-02-02 NOTE — Telephone Encounter (Signed)
 Requested medication (s) are due for refill today: Yes  Requested medication (s) are on the active medication list: Yes  Last refill:  07/27/23 and 08/04/23  Future visit scheduled: Yes  Notes to clinic:  Unable to refill per protocol due to failed labs, no updated results.      Requested Prescriptions  Pending Prescriptions Disp Refills   glipiZIDE  (GLUCOTROL ) 10 MG tablet 180 tablet 0    Sig: Take 1 tablet (10 mg total) by mouth 2 (two) times daily before a meal.     Endocrinology:  Diabetes - Sulfonylureas Failed - 02/02/2024  4:50 PM      Failed - HBA1C is between 0 and 7.9 and within 180 days    HbA1c, POC (controlled diabetic range)  Date Value Ref Range Status  08/04/2023 6.1 0.0 - 7.0 % Final         Failed - Cr in normal range and within 360 days    Creat  Date Value Ref Range Status  08/03/2014 0.60 0.50 - 1.10 mg/dL Final   Creatinine, Ser  Date Value Ref Range Status  07/08/2022 0.62 0.57 - 1.00 mg/dL Final   Creatinine, Urine  Date Value Ref Range Status  08/25/2012 224.9 mg/dL Final         Failed - Valid encounter within last 6 months    Recent Outpatient Visits           6 months ago Type 2 diabetes mellitus without complication, without long-term current use of insulin  (HCC)   [place] [facility] Celestia Rosaline SQUIBB, NP   9 months ago Type 2 diabetes mellitus without complication, without long-term current use of insulin  (HCC)   [place] [facility] Celestia Rosaline SQUIBB, NP   1 year ago Type 2 diabetes mellitus without complication, without long-term current use of insulin  (HCC)   [place] [facility] Celestia Rosaline SQUIBB, NP   1 year ago Type 2 diabetes mellitus without complication, without long-term current use of insulin  (HCC)   [place] [facility] Celestia Rosaline SQUIBB, NP   1 year ago Type 2 diabetes mellitus without complication, without long-term current use of insulin   (HCC)   [place] [facility] Celestia Rosaline SQUIBB, NP               lisinopril  (ZESTRIL ) 5 MG tablet 90 tablet 1    Sig: TAKE 1 TABLET BY MOUTH ONCE DAILY .     Cardiovascular:  ACE Inhibitors Failed - 02/02/2024  4:50 PM      Failed - Cr in normal range and within 180 days    Creat  Date Value Ref Range Status  08/03/2014 0.60 0.50 - 1.10 mg/dL Final   Creatinine, Ser  Date Value Ref Range Status  07/08/2022 0.62 0.57 - 1.00 mg/dL Final   Creatinine, Urine  Date Value Ref Range Status  08/25/2012 224.9 mg/dL Final         Failed - K in normal range and within 180 days    Potassium  Date Value Ref Range Status  07/08/2022 5.0 3.5 - 5.2 mmol/L Final         Failed - Valid encounter within last 6 months    Recent Outpatient Visits           6 months ago Type 2 diabetes mellitus without complication, without long-term current use of insulin  [facility])   [place] [facility] Celestia Rosaline SQUIBB, NP  9 months ago Type 2 diabetes mellitus without complication, without long-term current use of insulin  (HCC)   [place] [facility] Celestia Rosaline SQUIBB, NP   1 year ago Type 2 diabetes mellitus without complication, without long-term current use of insulin  (HCC)   [place] [facility] Celestia Rosaline SQUIBB, NP   1 year ago Type 2 diabetes mellitus without complication, without long-term current use of insulin  (HCC)   [place] [facility] Celestia Rosaline SQUIBB, NP   1 year ago Type 2 diabetes mellitus without complication, without long-term current use of insulin  (HCC)   [place] [facility] Celestia Rosaline SQUIBB, NP              Passed - Patient is not pregnant      Passed - Last BP in normal range    BP Readings from Last 1 Encounters:  08/27/23 127/73          metFORMIN  (GLUCOPHAGE ) 1000 MG tablet 180 tablet 0    Sig: Take 1 tablet (1,000 mg total) by mouth 2  (two) times daily.     Endocrinology:  Diabetes - Biguanides Failed - 02/02/2024  4:50 PM      Failed - Cr in normal range and within 360 days    Creat  Date Value Ref Range Status  08/03/2014 0.60 0.50 - 1.10 mg/dL Final   Creatinine, Ser  Date Value Ref Range Status  07/08/2022 0.62 0.57 - 1.00 mg/dL Final   Creatinine, Urine  Date Value Ref Range Status  08/25/2012 224.9 mg/dL Final         Failed - HBA1C is between 0 and 7.9 and within 180 days    HbA1c, POC (controlled diabetic range)  Date Value Ref Range Status  08/04/2023 6.1 0.0 - 7.0 % Final         Failed - eGFR in normal range and within 360 days    GFR calc Af Amer  Date Value Ref Range Status  02/01/2020 120 >59 mL/min/1.73 Final    Comment:    **[facility] currently reports eGFR in compliance with the current**   recommendations of the [employer]. [facility] will   update reporting as new guidelines are published from the NKF-ASN   Task force.    GFR calc non Af Amer  Date Value Ref Range Status  02/01/2020 104 >59 mL/min/1.73 Final   eGFR  Date Value Ref Range Status  07/08/2022 105 >59 mL/min/1.73 Final         Failed - B12 Level in normal range and within 720 days    No results found for: VITAMINB12       Failed - Valid encounter within last 6 months    Recent Outpatient Visits           6 months ago Type 2 diabetes mellitus without complication, without long-term current use of insulin  (HCC)   [place] [facility] Celestia Rosaline SQUIBB, NP   9 months ago Type 2 diabetes mellitus without complication, without long-term current use of insulin  (HCC)   [place] [facility] Celestia Rosaline SQUIBB, NP   1 year ago Type 2 diabetes mellitus without complication, without long-term current use of insulin  (HCC)   [place] [facility] Celestia Rosaline SQUIBB, NP   1 year ago Type 2 diabetes mellitus without complication, without long-term current  use of insulin  [facility])   [place] [facility] Celestia Rosaline SQUIBB, NP  1 year ago Type 2 diabetes mellitus without complication, without long-term current use of insulin  (HCC)   [place] [facility] Celestia Rosaline SQUIBB, NP              Failed - CBC within normal limits and completed in the last 12 months    WBC  Date Value Ref Range Status  07/08/2022 7.0 3.4 - 10.8 x10E3/uL Final  08/03/2014 6.9 4.0 - 10.5 K/uL Final   RBC  Date Value Ref Range Status  07/08/2022 3.74 (L) 3.77 - 5.28 x10E6/uL Final  08/03/2014 4.09 3.87 - 5.11 MIL/uL Final   Hemoglobin  Date Value Ref Range Status  07/08/2022 11.4 11.1 - 15.9 g/dL Final   Hematocrit  Date Value Ref Range Status  07/08/2022 34.8 34.0 - 46.6 % Final   MCHC  Date Value Ref Range Status  07/08/2022 32.8 31.5 - 35.7 g/dL Final  96/96/7983 68.3 30.0 - 36.0 g/dL Final   [facility]  Date Value Ref Range Status  07/08/2022 30.5 26.6 - 33.0 pg Final  08/03/2014 28.6 26.0 - 34.0 pg Final   MCV  Date Value Ref Range Status  07/08/2022 93 79 - 97 fL Final   No results found for: PLTCOUNTKUC, LABPLAT, POCPLA RDW  Date Value Ref Range Status  07/08/2022 12.8 11.7 - 15.4 % Final

## 2024-02-05 MED ORDER — GLIPIZIDE 10 MG PO TABS: 10.0000 mg | ORAL_TABLET | Freq: Two times a day (BID) | ORAL | 0 refills | Status: DC

## 2024-02-05 MED ORDER — METFORMIN HCL 1000 MG PO TABS: 1000.0000 mg | ORAL_TABLET | Freq: Two times a day (BID) | ORAL | 0 refills | Status: DC

## 2024-02-05 MED ORDER — LISINOPRIL 5 MG PO TABS: ORAL_TABLET | ORAL | 1 refills | Status: DC

## 2024-02-19 ENCOUNTER — Telehealth (INDEPENDENT_AMBULATORY_CARE_PROVIDER_SITE_OTHER): Payer: Self-pay | Admitting: Primary Care

## 2024-02-19 NOTE — Telephone Encounter (Signed)
 Contacted pt offer MU pt states she will be ok until her next appt

## 2024-02-24 ENCOUNTER — Ambulatory Visit (INDEPENDENT_AMBULATORY_CARE_PROVIDER_SITE_OTHER): Admitting: Primary Care

## 2024-03-23 ENCOUNTER — Ambulatory Visit (INDEPENDENT_AMBULATORY_CARE_PROVIDER_SITE_OTHER): Admitting: Primary Care

## 2024-03-23 ENCOUNTER — Encounter (INDEPENDENT_AMBULATORY_CARE_PROVIDER_SITE_OTHER): Payer: Self-pay | Admitting: Primary Care

## 2024-03-23 VITALS — BP 130/77 | HR 78 | Resp 16 | Ht 66.5 in | Wt 204.6 lb

## 2024-03-23 DIAGNOSIS — E782 Mixed hyperlipidemia: Secondary | ICD-10-CM

## 2024-03-23 DIAGNOSIS — Z23 Encounter for immunization: Secondary | ICD-10-CM

## 2024-03-23 DIAGNOSIS — I10 Essential (primary) hypertension: Secondary | ICD-10-CM | POA: Diagnosis not present

## 2024-03-23 DIAGNOSIS — E119 Type 2 diabetes mellitus without complications: Secondary | ICD-10-CM

## 2024-03-23 DIAGNOSIS — Z760 Encounter for issue of repeat prescription: Secondary | ICD-10-CM

## 2024-03-23 DIAGNOSIS — Z7984 Long term (current) use of oral hypoglycemic drugs: Secondary | ICD-10-CM

## 2024-03-23 MED ORDER — GLIPIZIDE 10 MG PO TABS: 10.0000 mg | ORAL_TABLET | Freq: Two times a day (BID) | ORAL | 0 refills | Status: DC

## 2024-03-23 MED ORDER — LISINOPRIL 5 MG PO TABS: ORAL_TABLET | ORAL | 1 refills | Status: AC

## 2024-03-23 MED ORDER — METFORMIN HCL 1000 MG PO TABS: 1000.0000 mg | ORAL_TABLET | Freq: Two times a day (BID) | ORAL | 0 refills | Status: DC

## 2024-03-23 NOTE — Progress Notes (Signed)
 Subjective:  Patient ID: Christine Pineda, female    DOB: Nov 14, 1965  Age: 58 y.o. 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 Patient does not check blood sugar at home Diabetes    Compliant with meds - Yes Checking CBGs? No  Fasting avg -   Postprandial average -  Exercising regularly? - Yes Watching carbohydrate intake? - Yes Neuropathy ? - Yes little bit Hypoglycemic events - No  - Recovers with :   Pertinent ROS:  Polyuria - No Polydipsia - No Vision problems - No  HTN-Patient has No headache, No chest pain, No abdominal pain - No Nausea, No new weakness tingling or numbness, No Cough - shortness of breath/s  Medications as noted below. Taking them regularly without complication/adverse reaction being reported today.   History Christine Pineda has a past medical history of Diabetes mellitus without complication (HCC) (2008).   She has a past surgical history that includes Tubal ligation (Bilateral, 06/02/2006) and Cesarean section.   Her family history includes Diabetes in her father.She reports that she has never smoked. She has never used smokeless tobacco. She reports that she does not drink alcohol and does not use drugs.  Current Outpatient Medications on File Prior to Visit  Medication Sig Dispense Refill   atorvastatin  (LIPITOR) 10 MG tablet Take 1 tablet every other day (Patient not taking: Reported on 08/27/2023) 90 tablet 1   ibuprofen  (ADVIL ) 800 MG tablet Take 1 tablet (800 mg total) by mouth every 8 (eight) hours as needed. 90 tablet 1   No current facility-administered medications on file prior to visit.    Review of Systems Comprehensive ROS Pertinent positive and negative noted in HPI   Objective:  BP 130/77   Pulse 78   Resp 16   Ht 5' 6.5 (1.689 m)   Wt 204 lb 9.6 oz (92.8 kg)   LMP 08/14/2020 (Exact Date) Comment: last 3-4 days light -  spotting recently not a regular spotting  SpO2 99%   BMI 32.53 kg/m   BP Readings from Last 3 Encounters:  03/23/24 130/77  08/27/23 127/73  08/04/23 132/73    Wt Readings from Last 3 Encounters:  03/23/24 204 lb 9.6 oz (92.8 kg)  08/27/23 202 lb 14.4 oz (92 kg)  08/04/23 205 lb (93 kg)    Physical Exam Vitals reviewed.  Constitutional:      Appearance: Normal appearance. She is obese.  HENT:     Head: Normocephalic.     Right Ear: Tympanic membrane, ear canal and external ear normal.     Left Ear: Tympanic membrane, ear canal and external ear normal.     Nose: Nose normal.     Mouth/Throat:     Mouth: Mucous membranes are moist.  Eyes:     Extraocular Movements: Extraocular movements intact.     Pupils: Pupils are equal, round, and reactive to light.  Cardiovascular:     Rate and Rhythm: Normal rate.  Pulmonary:     Effort: Pulmonary effort is normal.     Breath sounds: Normal breath sounds.  Abdominal:     General: Bowel sounds are normal.     Palpations: Abdomen is soft.  Musculoskeletal:        General: Normal range of motion.     Cervical back: Normal range of motion and neck supple.  Skin:    General: Skin is warm and dry.  Neurological:  Mental Status: She is alert and oriented to person, place, and time.  Psychiatric:        Mood and Affect: Mood normal.        Behavior: Behavior normal.        Thought Content: Thought content normal.     Lab Results  Component Value Date   HGBA1C 6.1 08/04/2023   HGBA1C 6.2 04/29/2023   HGBA1C 9.3 (A) 01/27/2023    Lab Results  Component Value Date   WBC 7.0 07/08/2022   HGB 11.4 07/08/2022   HCT 34.8 07/08/2022   PLT 273 07/08/2022   GLUCOSE 193 (H) 07/08/2022   CHOL 168 07/08/2022   TRIG 117 07/08/2022   HDL 57 07/08/2022   LDLCALC 90 07/08/2022   ALT 12 07/08/2022   AST 11 07/08/2022   NA 139 07/08/2022   K 5.0 07/08/2022   CL 97 07/08/2022   CREATININE 0.62 07/08/2022   BUN 8 07/08/2022    CO2 25 07/08/2022   TSH 1.750 03/11/2018   HGBA1C 6.1 08/04/2023   MICROALBUR <0.2 08/27/2015    Title   Diabetic Foot Exam - detailed    Semmes-Weinstein Monofilament Test + means has sensation and - means no sensation      Image components are not supported.   Image components are not supported. Image components are not supported.  [place] Comments      Assessment & Plan:  Christine Pineda was seen today for diabetes.  Diagnoses and all orders for this visit:  Type 2 diabetes mellitus without complication, without long-term current use of insulin  (HCC) - educated on lifestyle modifications, including but not limited to diet choices and adding exercise to daily routine.   -     Hemoglobin A1c -     Lipid panel -     glipiZIDE  (GLUCOTROL ) 10 MG tablet; Take 1 tablet (10 mg total) by mouth 2 (two) times daily before a meal. -     metFORMIN  (GLUCOPHAGE ) 1000 MG tablet; Take 1 tablet (1,000 mg total) by mouth 2 (two) times daily. -     Ambulatory referral to Ophthalmology -     Microalbumin / creatinine urine ratio  Essential hypertension BP goal - < 130/80 controlled  Explained that having normal blood pressure is the goal and medications are helping to get to goal and maintain normal blood pressure. DIET: Limit salt intake, read nutrition labels to check salt content, limit fried and high fatty foods  Avoid using multisymptom OTC cold preparations that generally contain sudafed which can rise BP. Consult with pharmacist on best cold relief products to use for persons with HTN EXERCISE Discussed incorporating exercise such as walking - 30 minutes most days of the week and can do in 10 minute intervals    -     CBC with Differential/Platelet -     CMP14+EGFR -     lisinopril  (ZESTRIL ) 5 MG tablet; TAKE 1 TABLET BY MOUTH ONCE DAILY .  Mixed hyperlipidemia  - Healthy lifestyle diet of fruits vegetables fish nuts whole grains and low saturated fat . Foods high in  cholesterol or liver, fatty meats,cheese, butter avocados, nuts and seeds, chocolate and fried foods. -     Lipid panel  Encounter for immunization -     Flu vaccine trivalent PF, 6mos and older(Flulaval,Afluria,Fluarix,Fluzone)  Medication refill -     glipiZIDE  (GLUCOTROL ) 10 MG tablet; Take 1 tablet (10 mg total) by mouth 2 (two) times daily before a meal. -  lisinopril  (ZESTRIL ) 5 MG tablet; TAKE 1 TABLET BY MOUTH ONCE DAILY . -     metFORMIN  (GLUCOPHAGE ) 1000 MG tablet; Take 1 tablet (1,000 mg total) by mouth 2 (two) times daily.     Follow-up:  Return in about 6 months (around 09/21/2024) for fasting labs.  The above assessment and management plan was discussed with the patient. The patient verbalized understanding of and has agreed to the management plan. Patient is aware to call the clinic if symptoms fail to improve or worsen. Patient is aware when to return to the clinic for a follow-up visit. Patient educated on when it is appropriate to go to the emergency department.   Rosaline Bohr, NP-C

## 2024-03-24 LAB — HEMOGLOBIN A1C
Est. average glucose Bld gHb Est-mCnc: 160 mg/dL
Hgb A1c MFr Bld: 7.2 % — ABNORMAL HIGH (ref 4.8–5.6)

## 2024-03-24 LAB — CBC WITH DIFFERENTIAL/PLATELET
Basophils Absolute: 0 x10E3/uL (ref 0.0–0.2)
Basos: 0 %
EOS (ABSOLUTE): 0 x10E3/uL (ref 0.0–0.4)
Eos: 0 %
Hematocrit: 33.8 % — ABNORMAL LOW (ref 34.0–46.6)
Hemoglobin: 10.9 g/dL — ABNORMAL LOW (ref 11.1–15.9)
Immature Grans (Abs): 0 x10E3/uL (ref 0.0–0.1)
Immature Granulocytes: 0 %
Lymphocytes Absolute: 1.3 x10E3/uL (ref 0.7–3.1)
Lymphs: 13 %
MCH: 30.8 pg (ref 26.6–33.0)
MCHC: 32.2 g/dL (ref 31.5–35.7)
MCV: 96 fL (ref 79–97)
Monocytes Absolute: 0.7 x10E3/uL (ref 0.1–0.9)
Monocytes: 7 %
Neutrophils Absolute: 8.3 x10E3/uL — ABNORMAL HIGH (ref 1.4–7.0)
Neutrophils: 80 %
Platelets: 324 x10E3/uL (ref 150–450)
RBC: 3.54 x10E6/uL — ABNORMAL LOW (ref 3.77–5.28)
RDW: 12.1 % (ref 11.7–15.4)
WBC: 10.4 x10E3/uL (ref 3.4–10.8)

## 2024-03-24 LAB — CMP14+EGFR
ALT: 11 IU/L (ref 0–32)
AST: 9 IU/L (ref 0–40)
Albumin: 4.5 g/dL (ref 3.8–4.9)
Alkaline Phosphatase: 86 IU/L (ref 49–135)
BUN/Creatinine Ratio: 17 (ref 9–23)
BUN: 10 mg/dL (ref 6–24)
Bilirubin Total: 0.3 mg/dL (ref 0.0–1.2)
CO2: 23 mmol/L (ref 20–29)
Calcium: 9.6 mg/dL (ref 8.7–10.2)
Chloride: 100 mmol/L (ref 96–106)
Creatinine, Ser: 0.6 mg/dL (ref 0.57–1.00)
Globulin, Total: 3.2 g/dL (ref 1.5–4.5)
Glucose: 166 mg/dL — ABNORMAL HIGH (ref 70–99)
Potassium: 4.9 mmol/L (ref 3.5–5.2)
Sodium: 138 mmol/L (ref 134–144)
Total Protein: 7.7 g/dL (ref 6.0–8.5)
eGFR: 105 mL/min/1.73 (ref >59)

## 2024-03-24 LAB — LIPID PANEL
Chol/HDL Ratio: 3.6 ratio (ref 0.0–4.4)
Cholesterol, Total: 214 mg/dL — ABNORMAL HIGH (ref 100–199)
HDL: 59 mg/dL (ref >39)
LDL Chol Calc (NIH): 137 mg/dL — ABNORMAL HIGH (ref 0–99)
Triglycerides: 104 mg/dL (ref 0–149)
VLDL Cholesterol Cal: 18 mg/dL (ref 5–40)

## 2024-03-24 LAB — MICROALBUMIN / CREATININE URINE RATIO
Creatinine, Urine: 14.8 mg/dL
Microalb/Creat Ratio: <20 mg/g{creat} (ref 0–29)
Microalbumin, Urine: <3 ug/mL

## 2024-03-28 ENCOUNTER — Ambulatory Visit (INDEPENDENT_AMBULATORY_CARE_PROVIDER_SITE_OTHER): Payer: Self-pay | Admitting: Primary Care

## 2024-03-28 MED ORDER — ATORVASTATIN CALCIUM 20 MG PO TABS: 20.0000 mg | ORAL_TABLET | Freq: Every day | ORAL | 3 refills | Status: AC

## 2024-06-21 ENCOUNTER — Other Ambulatory Visit (INDEPENDENT_AMBULATORY_CARE_PROVIDER_SITE_OTHER): Payer: Self-pay | Admitting: Primary Care

## 2024-06-21 DIAGNOSIS — E119 Type 2 diabetes mellitus without complications: Secondary | ICD-10-CM

## 2024-06-21 DIAGNOSIS — Z760 Encounter for issue of repeat prescription: Secondary | ICD-10-CM

## 2024-06-22 ENCOUNTER — Other Ambulatory Visit (INDEPENDENT_AMBULATORY_CARE_PROVIDER_SITE_OTHER): Payer: Self-pay

## 2024-06-22 DIAGNOSIS — E119 Type 2 diabetes mellitus without complications: Secondary | ICD-10-CM

## 2024-06-22 DIAGNOSIS — Z760 Encounter for issue of repeat prescription: Secondary | ICD-10-CM

## 2024-06-22 MED ORDER — METFORMIN HCL 1000 MG PO TABS: 1000.0000 mg | ORAL_TABLET | Freq: Two times a day (BID) | ORAL | 1 refills | Status: AC

## 2024-06-22 MED ORDER — GLIPIZIDE 10 MG PO TABS: 10.0000 mg | ORAL_TABLET | Freq: Two times a day (BID) | ORAL | 1 refills | Status: AC

## 2024-09-21 ENCOUNTER — Ambulatory Visit (INDEPENDENT_AMBULATORY_CARE_PROVIDER_SITE_OTHER): Admitting: Primary Care
